# Patient Record
Sex: Female | Born: 1955 | Race: White | Hispanic: No | Smoking: Current some day smoker
Health system: Southern US, Community
[De-identification: ages and names within clinical notes are randomized; demographics above are authoritative.]

## PROBLEM LIST (undated history)

## (undated) DIAGNOSIS — Z8249 Family history of ischemic heart disease and other diseases of the circulatory system: Secondary | ICD-10-CM

## (undated) DIAGNOSIS — F17200 Nicotine dependence, unspecified, uncomplicated: Secondary | ICD-10-CM

## (undated) DIAGNOSIS — Z973 Presence of spectacles and contact lenses: Secondary | ICD-10-CM

## (undated) DIAGNOSIS — E0500 Thyrotoxicosis with diffuse goiter without thyrotoxic crisis or storm: Secondary | ICD-10-CM

## (undated) DIAGNOSIS — Z87442 Personal history of urinary calculi: Secondary | ICD-10-CM

## (undated) DIAGNOSIS — M8580 Other specified disorders of bone density and structure, unspecified site: Secondary | ICD-10-CM

## (undated) DIAGNOSIS — Z86010 Personal history of colonic polyps: Secondary | ICD-10-CM

## (undated) DIAGNOSIS — Z8541 Personal history of malignant neoplasm of cervix uteri: Secondary | ICD-10-CM

## (undated) DIAGNOSIS — T7840XA Allergy, unspecified, initial encounter: Secondary | ICD-10-CM

## (undated) HISTORY — DX: Other specified disorders of bone density and structure, unspecified site: M85.80

## (undated) HISTORY — PX: ABDOMINAL HYSTERECTOMY: SHX81

## (undated) HISTORY — DX: Personal history of urinary calculi: Z87.442

## (undated) HISTORY — DX: Nicotine dependence, unspecified, uncomplicated: F17.200

## (undated) HISTORY — DX: Personal history of malignant neoplasm of cervix uteri: Z85.41

## (undated) HISTORY — DX: Thyrotoxicosis with diffuse goiter without thyrotoxic crisis or storm: E05.00

## (undated) HISTORY — DX: Family history of ischemic heart disease and other diseases of the circulatory system: Z82.49

## (undated) HISTORY — DX: Allergy, unspecified, initial encounter: T78.40XA

## (undated) HISTORY — PX: COLONOSCOPY: SHX174

## (undated) HISTORY — DX: Presence of spectacles and contact lenses: Z97.3

## (undated) HISTORY — PX: COLPOSCOPY: SHX161

## (undated) HISTORY — DX: Personal history of colonic polyps: Z86.010

---

## 2005-04-30 DIAGNOSIS — Z860100 Personal history of colon polyps, unspecified: Secondary | ICD-10-CM

## 2005-04-30 HISTORY — DX: Personal history of colonic polyps: Z86.010

## 2005-04-30 HISTORY — DX: Personal history of colon polyps, unspecified: Z86.0100

## 2005-11-13 ENCOUNTER — Ambulatory Visit: Payer: Self-pay | Admitting: Family Medicine

## 2005-11-21 ENCOUNTER — Ambulatory Visit: Payer: Self-pay | Admitting: Family Medicine

## 2005-11-21 ENCOUNTER — Other Ambulatory Visit: Admission: RE | Admit: 2005-11-21 | Discharge: 2005-11-21 | Payer: Self-pay | Admitting: Family Medicine

## 2005-11-21 LAB — HM PAP SMEAR: HM Pap smear: NEGATIVE

## 2005-11-23 ENCOUNTER — Ambulatory Visit: Payer: Self-pay | Admitting: Gastroenterology

## 2005-12-12 ENCOUNTER — Encounter (INDEPENDENT_AMBULATORY_CARE_PROVIDER_SITE_OTHER): Payer: Self-pay | Admitting: Specialist

## 2005-12-12 ENCOUNTER — Ambulatory Visit: Payer: Self-pay | Admitting: Gastroenterology

## 2005-12-12 LAB — HM COLONOSCOPY

## 2006-01-22 ENCOUNTER — Ambulatory Visit: Payer: Self-pay | Admitting: Family Medicine

## 2007-01-20 ENCOUNTER — Ambulatory Visit: Payer: Self-pay | Admitting: Family Medicine

## 2007-02-27 ENCOUNTER — Ambulatory Visit: Payer: Self-pay | Admitting: Family Medicine

## 2008-03-01 ENCOUNTER — Ambulatory Visit: Payer: Self-pay | Admitting: Family Medicine

## 2008-03-29 ENCOUNTER — Ambulatory Visit: Payer: Self-pay | Admitting: Family Medicine

## 2009-01-18 ENCOUNTER — Ambulatory Visit: Payer: Self-pay | Admitting: Family Medicine

## 2009-02-03 LAB — HM DEXA SCAN

## 2009-02-09 ENCOUNTER — Ambulatory Visit: Payer: Self-pay | Admitting: Family Medicine

## 2009-03-28 ENCOUNTER — Ambulatory Visit: Payer: Self-pay | Admitting: Family Medicine

## 2009-10-26 ENCOUNTER — Ambulatory Visit: Payer: Self-pay | Admitting: Family Medicine

## 2009-11-25 ENCOUNTER — Ambulatory Visit: Payer: Self-pay | Admitting: Family Medicine

## 2009-12-28 ENCOUNTER — Ambulatory Visit: Payer: Self-pay | Admitting: Vascular Surgery

## 2009-12-28 ENCOUNTER — Ambulatory Visit: Payer: Self-pay | Admitting: Family Medicine

## 2010-03-28 ENCOUNTER — Ambulatory Visit: Payer: Self-pay | Admitting: Vascular Surgery

## 2010-04-30 HISTORY — PX: VEIN SURGERY: SHX48

## 2010-05-17 ENCOUNTER — Ambulatory Visit
Admission: RE | Admit: 2010-05-17 | Discharge: 2010-05-17 | Payer: Self-pay | Source: Home / Self Care | Attending: Vascular Surgery | Admitting: Vascular Surgery

## 2010-05-18 NOTE — Assessment & Plan Note (Signed)
OFFICE VISIT  Jodi Flynn, Jodi Flynn DOB:  1956-02-23                                       05/17/2010 NGEXB#:[id number]  This patient underwent laser ablation of her left great saphenous vein from just below her knee to just below the saphenofemoral junction and multiple stab phlebectomies greater than 10 over her medial thigh, medial calf and lateral calf.  This was done under local anesthesia in our office.  She had no immediate complications and was discharged home. She will be seen again in 1 week with follow-up duplex.    Larina Earthly, M.D. Electronically Signed  TFE/MEDQ  D:  05/17/2010  T:  05/18/2010  Job:  [id number]  cc:   Sharlot Gowda, M.D.

## 2010-05-24 ENCOUNTER — Ambulatory Visit: Admit: 2010-05-24 | Payer: Self-pay | Admitting: Vascular Surgery

## 2010-05-24 ENCOUNTER — Ambulatory Visit
Admission: RE | Admit: 2010-05-24 | Discharge: 2010-05-24 | Payer: Self-pay | Source: Home / Self Care | Attending: Vascular Surgery | Admitting: Vascular Surgery

## 2010-05-25 NOTE — Assessment & Plan Note (Signed)
OFFICE VISIT  Jodi Flynn, Jodi Flynn DOB:  Feb 28, 1956                                       05/24/2010 GNFAO#:[id number]  This patient presents today in a 1-week follow-up after laser ablation of her left great saphenous vein, stab phlebectomy of multiple varicosities.  She had the  typical amount of discomfort over her ablation site..  She has some mild bruising as well.  She reports good pain relief with the ibuprofen.  She has been very compliant with her compression garment.  She underwent noninvasive vascular  follow-up with [place] today and this does show complete closure of her left great saphenous vein from the ablation site in her mid calf up to the saphenofemoral junction.  She does not have any evidence of DVT.  I am quite pleased with her initial follow-up and result as is this patient.  I plan to see her again on a p.Flynn.n. basis.    Larina Earthly, M.D. Electronically Signed  TFE/MEDQ  D:  05/24/2010  T:  05/25/2010  Job:  [id number]  cc:   Sharlot Gowda, M.D.

## 2010-06-02 NOTE — Procedures (Unsigned)
DUPLEX DEEP VENOUS EXAM - LOWER EXTREMITY  INDICATION:  Status post left greater saphenous vein ablation.  HISTORY:  Edema:  No. Trauma/Surgery:  Left greater saphenous vein ablation, 05/17/2010. Pain:  No. PE:  No. Previous DVT:  No. Anticoagulants:  No. Other:  DUPLEX EXAM:               CFV   SFV   PopV  PTV    GSV               R  L  R  L  R  L  R   L  R  L Thrombosis    o  o     o     o      o     + Spontaneous   +  +     +     +      +     o Phasic        +  +     +     +      +     o Augmentation  +  +     +     +      +     o Compressible  +  +     +     +      +     o Competent     +  +     +     +      +     +  Legend:  + - yes  o - no  p - partial  D - decreased  IMPRESSION:  No evidence of acute deep venous thrombosis within the left lower extremity.  Successful left greater saphenous vein ablation from the distal insertion site to saphenofemoral junction.   _____________________________ Larina Earthly, M.D.  OD/MEDQ  D:  05/24/2010  T:  05/24/2010  Job:  [id number]

## 2010-09-12 NOTE — Consult Note (Signed)
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  She is a very active healthy 55-  year-old white female who has had progressive pain in her right calf for  the past several years.  She reports this has been present for some time  but is having increased severe difficulty.  She reports pain  specifically over her varicosities and also an aching sensation at the  end of the day.  This requires her to stop walking and sit and raise her  leg for relief of this.  She does not have any similar symptoms in her  right leg.  She does not have any history of DVT or bleeding from her  veins.   Her past medical history is completely unremarkable.  She does have  history of hysterectomy in 1988.   SOCIAL HISTORY:  She is widowed with two children.  She quit smoking 2  months ago and does not drink alcohol.   Family history is positive for varicosities in her mother and father.   REVIEW OF SYSTEMS:  Is otherwise completely normal with no other medical  issues.   PHYSICAL EXAMINATION:  General:  A well-developed, well-nourished white  female appearing stated age, in no acute distress.  Vital signs:  Blood  pressure is 131/71, pulse 60, respirations 18, temperature is 97.4.  HEENT:  Normal.  Chest:  Nonlabored breathing, clear bilaterally.  Cardiovascular:  She has palpable radial and dorsalis pedis pulses  bilaterally.  Musculoskeletal:  She has no major deformities or  cyanosis.  Neurological:  No focal weakness or paresthesias.  Skin:  Without ulcers or rashes.  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  She has no  varicosities on the right leg.  She has no skin changes of chronic  venous hypertension.   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  I had a long discussion  with the patient explaining the significance of this.  I explained that  this is not dangerous and did not put her at risk for more serious  complications.  I did explain because of her pain as related to the  venous hypertension we have recommended continued elevation when  possible for symptom relief.  She will use Advil as well and we have  fitted her today with graduated compression garments 20-30 mmHg thigh-  high and instructed her on the daily use of these.  We will see her  again in 3 months to determine if she is having successful treatment  with these conservative therapies.  I did explain that she would be a  candidate for laser ablation of her saphenous vein and stab phlebectomy  of tributary varicosities should she fail conservative treatment.  We  will see her again in 3 months for continued followup.     Larina Earthly, M.D.  Electronically Signed   TFE/MEDQ  D:  12/28/2009  T:  12/29/2009  Job:  [id number]   cc:  Sharlot Gowda, M.D.

## 2010-09-12 NOTE — Procedures (Signed)
LOWER EXTREMITY VENOUS REFLUX EXAM   INDICATION:  Painful left leg.   EXAM:  Using color-flow imaging and pulse Doppler spectral analysis, the  left common femoral, superficial femoral, popliteal, posterior tibial,  greater and lesser saphenous veins are evaluated.  There is evidence  suggesting deep venous insufficiency in the left lower extremity.   The left saphenofemoral junction is not competent with Reflux of  >571milliseconds. The left GSV is not competent with Reflux of  >571milliseconds with the caliber as described below.   The left proximal short saphenous vein demonstrates competency.   GSV Diameter (used if found to be incompetent only)                                            Right    Left  Proximal Greater Saphenous Vein           cm       0.62 cm  Proximal-to-mid-thigh                     cm       cm  Mid thigh                                 cm       0.42 cm  Mid-distal thigh                          cm       cm  Distal thigh                              cm       0.39 cm  Knee                                      cm       0.25 cm   IMPRESSION:  1. Left greater saphenous vein is not competent with Reflux      >556milliseconds.  2. The left greater saphenous vein is not tortuous.  3. The deep venous system is not competent with Reflux of      >521milliseconds.  4. The left lesser saphenous vein is competent.   ___________________________________________  Larina Earthly, M.D.   CB/MEDQ  D:  12/29/2009  T:  12/29/2009  Job:  [id number]

## 2010-09-12 NOTE — Assessment & Plan Note (Signed)
OFFICE VISIT   [place], Reatha R  DOB:  04-03-1956                                       03/28/2010  JWJXB#:[id number]   The patient is here today for continued discussion regarding her left  leg discomfort.  She has worn compression garments for 3 months and  reports this has given her no improvement in her symptoms.  She reports  she has pain in her calf and over the varicosities in her medial  thigh  and calf with prolonged standing.  This occasionally can extend down  towards her groin.  She reports standing is extremely difficult due to  leg pain, shopping, cooking and cleaning is difficult.  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   I feel that she has clearly failed conservative therapy and have  recommended laser ablation of her left great saphenous vein and stab  phlebectomy of tributary varicosities.  I again imaged her vein with  SonoSite and these varicosities do extend off of the saphenous vein.  I  explained the treatment as outpatient under local anesthesia and she  wishes to proceed.  She does have a 2+ left posterior tibial pulse and a  2+ right dorsalis pedis pulse.  We will schedule this at her earliest  convenience.     Larina Earthly, M.D.  Electronically Signed   TFE/MEDQ  D:  03/28/2010  T:  03/29/2010  Job:  [id number]   cc:   Sharlot Gowda, M.D.

## 2011-01-26 ENCOUNTER — Encounter: Payer: Self-pay | Admitting: Gastroenterology

## 2011-02-13 ENCOUNTER — Telehealth: Payer: Self-pay | Admitting: Medical

## 2011-02-14 ENCOUNTER — Other Ambulatory Visit: Payer: Self-pay | Admitting: Medical

## 2011-02-14 NOTE — Telephone Encounter (Signed)
Patient wants a refill on her fosamax and her synthroid. She was last seen on 12/28/09. She said she mad an appointment for with for a CPE. What would you like for me to do? CLS

## 2011-02-14 NOTE — Telephone Encounter (Signed)
pls refill both meds x 31mo.  I don't have chart and meds not in computer.  So pull chart and do the refills.

## 2011-02-16 ENCOUNTER — Encounter: Payer: Self-pay | Admitting: Medical

## 2011-02-16 ENCOUNTER — Other Ambulatory Visit: Payer: Self-pay | Admitting: Medical

## 2011-02-16 ENCOUNTER — Other Ambulatory Visit (HOSPITAL_COMMUNITY)
Admission: RE | Admit: 2011-02-16 | Discharge: 2011-02-16 | Disposition: A | Discharge status: Home / Self Care | Payer: PRIVATE HEALTH INSURANCE | Source: Ambulatory Visit | Attending: Medical | Admitting: Medical

## 2011-02-16 ENCOUNTER — Ambulatory Visit (INDEPENDENT_AMBULATORY_CARE_PROVIDER_SITE_OTHER): Payer: PRIVATE HEALTH INSURANCE | Admitting: Medical

## 2011-02-16 DIAGNOSIS — F17200 Nicotine dependence, unspecified, uncomplicated: Secondary | ICD-10-CM

## 2011-02-16 DIAGNOSIS — M810 Age-related osteoporosis without current pathological fracture: Secondary | ICD-10-CM

## 2011-02-16 DIAGNOSIS — Z1231 Encounter for screening mammogram for malignant neoplasm of breast: Secondary | ICD-10-CM

## 2011-02-16 DIAGNOSIS — L209 Atopic dermatitis, unspecified: Secondary | ICD-10-CM

## 2011-02-16 DIAGNOSIS — E0500 Thyrotoxicosis with diffuse goiter without thyrotoxic crisis or storm: Secondary | ICD-10-CM

## 2011-02-16 DIAGNOSIS — Z01419 Encounter for gynecological examination (general) (routine) without abnormal findings: Secondary | ICD-10-CM | POA: Insufficient documentation

## 2011-02-16 DIAGNOSIS — Z23 Encounter for immunization: Secondary | ICD-10-CM

## 2011-02-16 DIAGNOSIS — L2089 Other atopic dermatitis: Secondary | ICD-10-CM

## 2011-02-16 DIAGNOSIS — Z0000 Encounter for general adult medical examination without abnormal findings: Secondary | ICD-10-CM

## 2011-02-16 LAB — COMPREHENSIVE METABOLIC PANEL
ALT: 12 U/L (ref 0–35)
AST: 16 U/L (ref 0–37)
Albumin: 4.6 g/dL (ref 3.5–5.2)
Alkaline Phosphatase: 44 U/L (ref 39–117)
BUN: 13 mg/dL (ref 6–23)
Creat: 0.87 mg/dL (ref 0.50–1.10)
Potassium: 4.4 mEq/L (ref 3.5–5.3)

## 2011-02-16 LAB — CBC WITH DIFFERENTIAL/PLATELET
Basophils Relative: 1 % (ref 0–1)
Eosinophils Absolute: 0.1 10*3/uL (ref 0.0–0.7)
HCT: 43.6 % (ref 36.0–46.0)
Hemoglobin: 14.6 g/dL (ref 12.0–15.0)
MCH: 32.9 pg (ref 26.0–34.0)
MCHC: 33.5 g/dL (ref 30.0–36.0)
Monocytes Absolute: 0.5 10*3/uL (ref 0.1–1.0)
Monocytes Relative: 7 % (ref 3–12)
RDW: 13.3 % (ref 11.5–15.5)

## 2011-02-16 LAB — POCT URINALYSIS DIPSTICK
Bilirubin, UA: NEGATIVE
Glucose, UA: NEGATIVE
Leukocytes, UA: NEGATIVE
Nitrite, UA: NEGATIVE
Urobilinogen, UA: NEGATIVE

## 2011-02-16 LAB — T4, FREE: Free T4: 1.47 ng/dL (ref 0.80–1.80)

## 2011-02-16 LAB — LIPID PANEL
HDL: 87 mg/dL (ref >39)
Triglycerides: 73 mg/dL (ref <150)

## 2011-02-16 MED ORDER — ALENDRONATE SODIUM 70 MG PO TABS: 70.0000 mg | ORAL_TABLET | ORAL | Status: DC

## 2011-02-16 MED ORDER — LEVOTHYROXINE SODIUM 150 MCG PO TABS: 150.0000 ug | ORAL_TABLET | Freq: Every day | ORAL | Status: DC

## 2011-02-16 MED ORDER — HYDROCORTISONE 2.5 % EX CREA: TOPICAL_CREAM | Freq: Two times a day (BID) | CUTANEOUS | Status: AC

## 2011-02-16 NOTE — Patient Instructions (Signed)
X Preventative Care for Adults - Female      MAINTAIN REGULAR HEALTH EXAMS:  A routine yearly physical is a good way to check in with your primary care provider about your health and preventive screening. It is also an opportunity to share updates about your health and any concerns you have, and receive a thorough all-over exam.   Most health insurance companies pay for at least some preventative services.  Check with your health plan for specific coverages.  WHAT PREVENTATIVE SERVICES DO WOMEN NEED?  Adult women should have their weight and blood pressure checked regularly.   Women age 45 and older should have their cholesterol levels checked regularly.  Women should be screened for cervical cancer with a Pap smear and pelvic exam beginning at either age 56, or 3 years after they become sexually activity.    Breast cancer screening generally begins at age 65 with a mammogram and breast exam by your primary care provider.    Beginning at age 65 and continuing to age 28, women should be screened for colorectal cancer.  Certain people may need continued testing until age 103.  Updating vaccinations is part of preventative care.  Vaccinations help protect against diseases such as the flu.  Osteoporosis is a disease in which the bones lose minerals and strength as we age. Women ages 33 and over should discuss this with their caregivers, as should women after menopause who have other risk factors.  Lab tests are generally done as part of preventative care to screen for anemia and blood disorders, to screen for problems with the kidneys and liver, to screen for bladder problems, to check blood sugar, and to check your cholesterol level.  Preventative services generally include counseling about diet, exercise, avoiding tobacco, drugs, excessive alcohol consumption, and sexually transmitted infections.    GENERAL RECOMMENDATIONS FOR GOOD HEALTH:  Healthy diet:  Eat a variety of foods,  including fruit, vegetables, animal or vegetable protein, such as meat, fish, chicken, and eggs, or beans, lentils, tofu, and grains, such as rice.  Drink plenty of water daily.  Decrease saturated fat in the diet, avoid lots of red meat, processed foods, sweets, fast foods, and fried foods.  Exercise:  Aerobic exercise helps maintain good heart health. At least 30-40 minutes of moderate-intensity exercise is recommended. For example, a brisk walk that increases your heart rate and breathing. This should be done on most days of the week.   Find a type of exercise or a variety of exercises that you enjoy so that it becomes a part of your daily life.  Examples are running, walking, swimming, water aerobics, and biking.  For motivation and support, explore group exercise such as aerobic class, spin class, Zumba, Yoga,or  martial arts, etc.    Set exercise goals for yourself, such as a certain weight goal, walk or run in a race such as a 5k walk/run.  Speak to your primary care provider about exercise goals.  Disease prevention:  If you smoke or chew tobacco, find out from your caregiver how to quit. It can literally save your life, no matter how long you have been a tobacco user. If you do not use tobacco, never begin.   Maintain a healthy diet and normal weight. Increased weight leads to problems with blood pressure and diabetes.   The Body Mass Index or BMI is a way of measuring how much of your body is fat. Having a BMI above 27 increases the risk of heart  disease, diabetes, hypertension, stroke and other problems related to obesity. Your caregiver can help determine your BMI and based on it develop an exercise and dietary program to help you achieve or maintain this important measurement at a healthful level.  High blood pressure causes heart and blood vessel problems.  Persistent high blood pressure should be treated with medicine if weight loss and exercise do not work.   Fat and  cholesterol leaves deposits in your arteries that can block them. This causes heart disease and vessel disease elsewhere in your body.  If your cholesterol is found to be high, or if you have heart disease or certain other medical conditions, then you may need to have your cholesterol monitored frequently and be treated with medication.   Ask if you should have a cardiac stress test if your history suggests this. A stress test is a test done on a treadmill that looks for heart disease. This test can find disease prior to there being a problem.  Menopause can be associated with physical symptoms and risks. Hormone replacement therapy is available to decrease these. You should talk to your caregiver about whether starting or continuing to take hormones is right for you.   Osteoporosis is a disease in which the bones lose minerals and strength as we age. This can result in serious bone fractures. Risk of osteoporosis can be identified using a bone density scan. Women ages 43 and over should discuss this with their caregivers, as should women after menopause who have other risk factors. Ask your caregiver whether you should be taking a calcium supplement and Vitamin D, to reduce the rate of osteoporosis.   Avoid drinking alcohol in excess (more than two drinks per day).  Avoid use of street drugs. Do not share needles with anyone. Ask for professional help if you need assistance or instructions on stopping the use of alcohol, cigarettes, and/or drugs.  Brush your teeth twice a day with fluoride toothpaste, and floss once a day. Good oral hygiene prevents tooth decay and gum disease. The problems can be painful, unattractive, and can cause other health problems. Visit your dentist for a routine oral and dental check up and preventive care every 6-12 months.   Look at your skin regularly.  Use a mirror to look at your back. Notify your caregivers of changes in moles, especially if there are changes in shapes,  colors, a size larger than a pencil eraser, an irregular border, or development of new moles.  Safety:  Use seatbelts 100% of the time, whether driving or as a passenger.  Use safety devices such as hearing protection if you work in environments with loud noise or significant background noise.  Use safety glasses when doing any work that could send debris in to the eyes.  Use a helmet if you ride a bike or motorcycle.  Use appropriate safety gear for contact sports.  Talk to your caregiver about gun safety.  Use sunscreen with a SPF (or skin protection factor) of 15 or greater.  Lighter skinned people are at a greater risk of skin cancer. Don't forget to also wear sunglasses in order to protect your eyes from too much damaging sunlight. Damaging sunlight can accelerate cataract formation.   Practice safe sex. Use condoms. Condoms are used for birth control and to help reduce the spread of sexually transmitted infections (or STIs).  Some of the STIs are gonorrhea (the clap), chlamydia, syphilis, trichomonas, herpes, HPV (human papilloma virus) and HIV (human immunodeficiency  virus) which causes AIDS. The herpes, HIV and HPV are viral illnesses that have no cure. These can result in disability, cancer and death.   Keep carbon monoxide and smoke detectors in your home functioning at all times. Change the batteries every 6 months or use a model that plugs into the wall.   Vaccinations:  Stay up to date with your tetanus shots and other required immunizations. You should have a booster for tetanus every 10 years. Be sure to get your flu shot every year, since 5%-20% of the [place] population comes down with the flu. The flu vaccine changes each year, so being vaccinated once is not enough. Get your shot in the fall, before the flu season peaks.   Other vaccines to consider:  Human Papilloma Virus or HPV causes cancer of the cervix, and other infections that can be transmitted from person to person. There is  a vaccine for HPV, and females should get immunized between the ages of 93 and 88. It requires a series of 3 shots.   Pneumococcal vaccine to protect against certain types of pneumonia.  This is normally recommended for adults age 62 or older.  However, adults younger than 56 years old with certain underlying conditions such as diabetes, heart or lung disease should also receive the vaccine.  Shingles vaccine to protect against Varicella Zoster if you are older than age 74, or younger than 56 years old with certain underlying illness.  Hepatitis A vaccine to protect against a form of infection of the liver by a virus acquired from food.  Hepatitis B vaccine to protect against a form of infection of the liver by a virus acquired from blood or body fluids, particularly if you work in health care.  If you plan to travel internationally, check with your local health department for specific vaccination recommendations.  Cancer Screening:  Breast cancer screening is essential to preventive care for women. All women age 85 and older should perform a breast self-exam every month. At age 8 and older, women should have their caregiver complete a breast exam each year. Women at ages 50 and older should have a mammogram (x-ray film) of the breasts. Your caregiver can discuss how often you need mammograms.    Cervical cancer screening includes taking a Pap smear (sample of cells examined under a microscope) from the cervix (end of the uterus). It also includes testing for HPV (Human Papilloma Virus, which can cause cervical cancer). Screening and a pelvic exam should begin at age 72, or 3 years after a woman becomes sexually active. Screening should occur every year, with a Pap smear but no HPV testing, up to age 40. After age 63, you should have a Pap smear every 3 years with HPV testing, if no HPV was found previously.   Most routine colon cancer screening begins at the age of 39. On a yearly basis, doctors  may provide special easy to use take-home tests to check for hidden blood in the stool. Sigmoidoscopy or colonoscopy can detect the earliest forms of colon cancer and is life saving. These tests use a small camera at the end of a tube to directly examine the colon. Speak to your caregiver about this at age 73, when routine screening begins (and is repeated every 5 years unless early forms of pre-cancerous polyps or small growths are found).

## 2011-02-16 NOTE — Telephone Encounter (Signed)
Refilled the patients 2 medications per [facility]. CLS

## 2011-02-16 NOTE — Progress Notes (Signed)
Subjective:   HPI  Jodi Flynn is a 55 y.o. female who presents for a complete physical.  Needs refills on meds.  She is fasting today.   Only recent problem is rash on hands.  Gets this rash from time to time.  Using nothing for the rash.    She was widowed 2 years ago.  She is in a monogamous relationship currently and doing well.  Is doing some "bucket list" things.  recently started riding a [employer], went scuba diving in the [place] recently.  No other c/o.  Last mammogram 2011, last pap smear 2011?  Declines flu shot.    Reviewed their medical, surgical, family, social, medication, and allergy history and updated chart as appropriate.  Past Medical History  Diagnosis Date  . Hx of colonic polyps 2007  . History of renal stone   . Tobacco use disorder   . Grave's disease   . History of cervical cancer     s/p hysterectomy  . Allergy   . Osteoporosis 01/2009    Bone Density 01/2009  . Wears glasses     Past Surgical History  Procedure Date  . Abdominal hysterectomy     ovaries intact  . Colposcopy     age 25  . Colonoscopy age 80, 12/12/05    Dr. Sheryn Bison  . Vein surgery 1/12    laser ablation left great saphenous vein , stab phlebectomy of multiple varicosities, Dr. Arbie Cookey     Family History  Problem Relation Age of Onset  . COPD Mother     respiratory failure  . Kidney disease Mother   . Hypertension Mother   . Hyperlipidemia Mother   . Heart disease Father     died of MI, late 63s  . Hypertension Father   . Hyperlipidemia Father   . Stroke Father   . Hypertension Brother   . Hyperlipidemia Brother   . Kidney disease Brother     kidney stones  . Diabetes Brother     borderline  . Cancer Neg Hx     History   Social History  . Marital Status: Married    Spouse Name: N/A    Number of Children: N/A  . Years of Education: N/A   Occupational History  . 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 Smoking status:  Current Some Day Smoker -- 0.2 packs/day  . Smokeless tobacco: Never Used  . Alcohol Use: 1.0 oz/week    2 drink(s) per week  . Drug Use: No  . Sexually Active: Yes   Other Topics Concern  . Not on file   Social History Narrative   Widowed in 2010, in monogamous relationship currently, 2 children, walks for exercise, hobbies: puzzles    No current outpatient prescriptions on file prior to visit.    No Known Allergies   Review of Systems Constitutional: denies fever, chills, sweats, unexpected weight change, anorexia, fatigue Allergy: negative; denies recent sneezing, itching, congestion Dermatology: +rash on hands, intermittent; denies changing moles, lumps, new worrisome lesions ENT: no runny nose, ear pain, sore throat, hoarseness, sinus pain, teeth pain, tinnitus, hearing loss, epistaxis Cardiology: denies chest pain, palpitations, edema, orthopnea, paroxysmal nocturnal dyspnea Respiratory: denies cough, shortness of breath, dyspnea on exertion, wheezing, hemoptysis Gastroenterology: denies abdominal pain, nausea, vomiting, diarrhea, constipation, blood in stool, changes in bowel movement, dysphagia Hematology: denies bleeding or bruising problems Musculoskeletal: denies arthralgias, myalgias, joint swelling, back pain, neck pain, cramping,  gait changes Ophthalmology: denies vision changes, eye redness, itching, discharge Urology: denies dysuria, difficulty urinating, hematuria, urinary frequency, urgency, incontinence Neurology: no headache, weakness, tingling, numbness, speech abnormality, memory loss, falls, dizziness Psychology: denies depressed mood, agitation, sleep problems     Objective:   Physical Exam  Filed Vitals:   02/16/11 0855  BP: 118/70  Pulse: 72  Temp: 98 F (36.7 C)  Resp: 18    General appearance: alert, no distress, WD/WN, white female, looks stated age Skin: scattered benign appearing macules and papules, no particular worrisome lesions.   right preauricular area with two papules, both 2-3 mm, flesh colored, unchanged per pt for years, left ear tragus with horn like soft tissue growth, unchanged for years per pt HEENT: normocephalic, conjunctiva/corneas normal, sclerae anicteric, PERRLA, EOMi, nares patent, no discharge or erythema, pharynx normal Oral cavity: MMM, tongue normal, teeth in good repair Neck: supple, no lymphadenopathy, no thyromegaly, no masses, normal ROM, no bruits Chest: non tender, normal shape and expansion Heart: RRR, normal S1, S2, no murmurs Lungs: CTA bilaterally, no wheezes, rhonchi, or rales Abdomen: +bs, soft, non tender, non distended, no masses, no hepatomegaly, no splenomegaly, no bruits Back: non tender, normal ROM, no scoliosis Musculoskeletal: upper extremities non tender, no obvious deformity, normal ROM throughout, lower extremities non tender, no obvious deformity, normal ROM throughout Extremities: no edema, no cyanosis, no clubbing Pulses: 2+ symmetric, upper and lower extremities, normal cap refill Neurological: alert, oriented x 3, CN2-12 intact, strength normal upper extremities and lower extremities, sensation normal throughout, DTRs 2+ throughout, no cerebellar signs, gait normal Psychiatric: normal affect, behavior normal, pleasant  Breasts: nontender, no mass, no axillary lymphadenopathy Gyn: normal external genitalia, no discharge, no lesions, no adnexal mass or tenderness, swabs taken, s/p hysterectomy Anus - normal appearing, no mass, occult negative stool   Assessment :      Encounter Diagnoses  Name Primary?  . General medical examination Yes  . Grave's disease   . Osteoporosis   . Need for prophylactic vaccination and inoculation against influenza   . Visit for screening mammogram   . Osteoporosis, unspecified   . Tobacco use disorder   . Atopic dermatitis      Plan:    Physical exam - discussed healthy lifestyle, diet, exercise, preventative care, vaccinations,  and addressed their concerns.  Labs today.  She is UTD on tetanus vaccine.  She declines STD screening.   Grave's disease - thyroid labs today, med refills today  Osteoporosis - c/t Fosamax.  Repeat bone density exam.  Advised fall precautions.  C/t Ca+Vit D.    Flu vaccine and VIS given.  Tobacco use disorder - advised she completely stop smoking.    Recommended repeat screening colonoscopy.  She is due at this time. She will consider and let me know .  Will set up for screening mammogram.  Pap smear sent.    Atopic dermatitis - sent script for Hydrocortisone cream.

## 2011-02-19 ENCOUNTER — Telehealth: Payer: Self-pay | Admitting: Family Medicine

## 2011-02-19 LAB — VITAMIN D 25 HYDROXY (VIT D DEFICIENCY, FRACTURES): Vit D, 25-Hydroxy: 64 ng/mL (ref 30–89)

## 2011-02-19 NOTE — Telephone Encounter (Signed)
The lab tech. Added the vitamin D to the patients lab work. CLS

## 2011-02-19 NOTE — Telephone Encounter (Signed)
Message copied by Janeice Robinson on Mon Feb 19, 2011  4:32 PM ------      Message from: Robinson, [place]      Created: Fri Feb 16, 2011  6:06 PM       See if we can add Vitamin D level at lab.  I sent hydrocortisone cream for her hand rash.

## 2011-02-20 ENCOUNTER — Telehealth: Payer: Self-pay | Admitting: Medical

## 2011-02-20 NOTE — Telephone Encounter (Signed)
PATIENT WAS NOTIFIED. CLS 

## 2011-02-20 NOTE — Progress Notes (Signed)
LM for pt. To callback. CLS

## 2011-02-27 ENCOUNTER — Ambulatory Visit (INDEPENDENT_AMBULATORY_CARE_PROVIDER_SITE_OTHER): Payer: PRIVATE HEALTH INSURANCE | Admitting: Medical

## 2011-02-27 ENCOUNTER — Encounter: Payer: Self-pay | Admitting: Medical

## 2011-02-27 ENCOUNTER — Encounter: Payer: Self-pay | Admitting: Family Medicine

## 2011-02-27 DIAGNOSIS — R319 Hematuria, unspecified: Secondary | ICD-10-CM | POA: Insufficient documentation

## 2011-02-27 LAB — POCT URINALYSIS DIPSTICK
Nitrite, UA: NEGATIVE
Spec Grav, UA: 1.005
Urobilinogen, UA: NEGATIVE

## 2011-02-27 LAB — POC HEMOCCULT BLD/STL (OFFICE/1-CARD/DIAGNOSTIC): Fecal Occult Blood, POC: NEGATIVE

## 2011-02-27 MED ORDER — NITROFURANTOIN MONOHYD MACRO 100 MG PO CAPS: 100.0000 mg | ORAL_CAPSULE | Freq: Two times a day (BID) | ORAL | Status: AC

## 2011-02-27 NOTE — Patient Instructions (Signed)

## 2011-02-27 NOTE — Progress Notes (Signed)
Subjective:   HPI  Jodi Flynn is a 55 y.o. female who presents for 1 day hx/o hematuria.  She has been feeling fine, but this morning saw some blood initial with urination and on toilet paper, then while in the shower, saw some clumps/clots of blood.  Throughout the morning continued to get some pink urine.  She notes some urinary urgency and frequency.  No pain or burning.  Last UTI was age 41.  No hx/o hematuria other than with kidney stones in remote past.   Otherwise feels fine.  Of note I recently saw her for physical and she has been referred to gynecology for abnormal pap smear.  No other aggravating or relieving factors.    No other c/o.  The following portions of the patient's history were reviewed and updated as appropriate: allergies, current medications, past family history, past medical history, past social history, past surgical history and problem list.  Past Medical History  Diagnosis Date  . Hx of colonic polyps 2007  . History of renal stone   . Tobacco use disorder   . Grave's disease   . History of cervical cancer     s/p hysterectomy  . Allergy   . Osteoporosis 01/2009    Bone Density 01/2009  . Wears glasses   . Cancer     CERVICAL   Review of Systems Constitutional: -fever, -chills, -sweats, -unexpected -weight change,-fatigue ENT: -runny nose, -ear pain, -sore throat Cardiology:  -chest pain, -palpitations, -edema Respiratory: -cough, -shortness of breath, -wheezing Gastroenterology: -abdominal pain, -nausea, -vomiting, -diarrhea, -constipation Hematology: -bleeding or bruising problems Musculoskeletal: -arthralgias, -myalgias, -joint swelling, -back pain Ophthalmology: -vision changes Urology: -dysuria, -difficulty urinating, +hematuria, +urinary frequency, -urgency Neurology: -headache, -weakness, -tingling, -numbness    Objective:   Physical Exam  Filed Vitals:   02/27/11 0845  BP: 100/70  Pulse: 72  Temp: 97.7 F (36.5 C)  Resp: 16     General appearance: alert, no distress, WD/WN Abdomen: +bs, soft, non tender, non distended, no masses, no hepatomegaly, no splenomegaly Back: nontender, no CVA tenderness Pulses: 2+ symmetric, upper and lower extremities, normal cap refill   Assessment and Plan :    Encounter Diagnosis  Name Primary?  . Hematuria Yes     Urine microscopic today with several RBCs, some crenated RBCs, but not casts or other abnormalities.  Discussed possible etiologies with patient.  Her risk factors are age and smoker.  Will send for culture.  Will cover empirically with Macrobid.  If symptoms worsen or change in the meantime she will let me know.   Advised that regardless of culture, she will need to recheck to f/u on gross hematuria again in 2wk, sooner if not improving.

## 2011-03-01 NOTE — Progress Notes (Signed)
Lmom for the patient to callback. CLS 

## 2011-03-08 ENCOUNTER — Telehealth: Payer: Self-pay | Admitting: Family Medicine

## 2011-03-08 ENCOUNTER — Encounter: Payer: Self-pay | Admitting: Family Medicine

## 2011-03-08 NOTE — Telephone Encounter (Signed)
Message copied by Janeice Robinson on Thu Mar 08, 2011  9:28 AM ------      Message from: Medford, DAVID S      Created: Thu Mar 08, 2011  7:59 AM       Good news: her bone density scan on 02/22/11 shows that there has been a significant increase in her bone density from previous scans.  Thus, the medication is working.  C/t Fosamax as usual, get weight bearing exercise such as circuit training or lifting some weight at home or the gym at least once weekly.  C/t Ca 1500mg  + Vit D 1000 IU OTC daily.

## 2011-04-30 ENCOUNTER — Other Ambulatory Visit: Payer: Self-pay | Admitting: Medical

## 2011-04-30 ENCOUNTER — Telehealth: Payer: Self-pay | Admitting: Internal Medicine

## 2011-04-30 MED ORDER — NITROFURANTOIN MONOHYD MACRO 100 MG PO CAPS: 100.0000 mg | ORAL_CAPSULE | Freq: Two times a day (BID) | ORAL | Status: AC

## 2011-04-30 NOTE — Telephone Encounter (Signed)
Spoke with patient and let her know that rx for Macrobid was called to pharmacy and to return if not improving for OV. Pt verbalized understanding.

## 2011-04-30 NOTE — Telephone Encounter (Signed)
macrobid sent, but if not improving, then return.

## 2011-10-03 ENCOUNTER — Ambulatory Visit (INDEPENDENT_AMBULATORY_CARE_PROVIDER_SITE_OTHER): Payer: Commercial Managed Care - PPO | Admitting: Medical

## 2011-10-03 ENCOUNTER — Encounter: Payer: Self-pay | Admitting: Medical

## 2011-10-03 VITALS — BP 100/70 | HR 68 | Temp 97.9°F | Resp 16 | Wt 132.0 lb

## 2011-10-03 DIAGNOSIS — L255 Unspecified contact dermatitis due to plants, except food: Secondary | ICD-10-CM

## 2011-10-03 DIAGNOSIS — L237 Allergic contact dermatitis due to plants, except food: Secondary | ICD-10-CM

## 2011-10-03 MED ORDER — METHYLPREDNISOLONE SODIUM SUCC 125 MG IJ SOLR
125.0000 mg | Freq: Once | INTRAMUSCULAR | Status: AC
  Administered 2011-10-03: 125 mg via INTRAMUSCULAR

## 2011-10-03 NOTE — Patient Instructions (Signed)
Poison Ivy Poison ivy is a inflammation of the skin (contact dermatitis) caused by touching the allergens on the leaves of the ivy plant following previous exposure to the plant. The rash usually appears 48 hours after exposure. The rash is usually bumps (papules) or blisters (vesicles) in a linear pattern. Depending on your own sensitivity, the rash may simply cause redness and itching, or it may also progress to blisters which may break open. These must be well cared for to prevent secondary bacterial (germ) infection, followed by scarring. Keep any open areas dry, clean, dressed, and covered with an antibacterial ointment if needed. The eyes may also get puffy. The puffiness is worst in the morning and gets better as the day progresses. This dermatitis usually heals without scarring, within 2 to 3 weeks without treatment. HOME CARE INSTRUCTIONS  Thoroughly wash with soap and water as soon as you have been exposed to poison ivy. You have about one half hour to remove the plant resin before it will cause the rash. This washing will destroy the oil or antigen on the skin that is causing, or will cause, the rash. Be sure to wash under your fingernails as any plant resin there will continue to spread the rash. Do not rub skin vigorously when washing affected area. Poison ivy cannot spread if no oil from the plant remains on your body. A rash that has progressed to weeping sores will not spread the rash unless you have not washed thoroughly. It is also important to wash any clothes you have been wearing as these may carry active allergens. The rash will return if you wear the unwashed clothing, even several days later. Avoidance of the plant in the future is the best measure. Poison ivy plant can be recognized by the number of leaves. Generally, poison ivy has three leaves with flowering branches on a single stem. Diphenhydramine may be purchased over the counter and used as needed for itching. Do not drive with  this medication if it makes you drowsy.Ask your caregiver about medication for children. SEEK MEDICAL CARE IF:  Open sores develop.   Redness spreads beyond area of rash.   You notice purulent (pus-like) discharge.   You have increased pain.   Other signs of infection develop (such as fever).  Document Released: 04/13/2000 Document Revised: 04/05/2011 Document Reviewed: 03/02/2009 [employer] Patient Information 2012 [employer]. 

## 2011-10-03 NOTE — Progress Notes (Signed)
Addended by: Janeice Robinson on: 10/03/2011 10:38 AM   Modules accepted: Orders

## 2011-10-03 NOTE — Progress Notes (Signed)
  Subjective:   HPI  Jodi Flynn is a 56 y.o. female who presents for possible poison ivy dermatitis.   She does note remote hx/o rash with exposure as a child.  She was out in the "natural area" in her yard over the weekend cleaning out weeds and pulling weeds off of trees.  Was wearing tank top and shorts.  She has since gotten rash all over - arms, legs, torso, itchy. Using OTC Ivarest and Ivywash.  No other aggravating or relieving factors.    No other c/o.  The following portions of the patient's history were reviewed and updated as appropriate: allergies, current medications, past family history, past medical history, past social history, past surgical history and problem list.  Past Medical History  Diagnosis Date  . Hx of colonic polyps 2007  . History of renal stone   . Tobacco use disorder   . Grave's disease   . History of cervical cancer     s/p hysterectomy  . Allergy   . Osteoporosis 01/2009    Bone Density 01/2009  . Wears glasses   . Cancer     CERVICAL    No Known Allergies   Review of Systems ROS reviewed and was negative other than noted in HPI or above.    Objective:   Physical Exam  General appearance: alert, no distress, WD/WN Skin: scattered erythema, 2-3 mm papules and some serous drainage on upper and lower arms, similarly on upper and lower legs, similarly on left chest, back all consistent with plant dermatitis.    Assessment and Plan :     Encounter Diagnosis  Name Primary?  . Poison ivy dermatitis Yes   IM Solumedrol 125 mg today.  Advised OTC Benadryl BID, and she will use the Hydrocortisone 2.5% cream topically she has at home from previous visit.  Avoid reexposure.   Call/return if not improving.

## 2012-02-11 ENCOUNTER — Encounter: Payer: Self-pay | Admitting: Internal Medicine

## 2012-02-15 ENCOUNTER — Encounter: Payer: Self-pay | Admitting: Medical

## 2012-02-15 ENCOUNTER — Ambulatory Visit (INDEPENDENT_AMBULATORY_CARE_PROVIDER_SITE_OTHER): Payer: Commercial Managed Care - PPO | Admitting: Medical

## 2012-02-15 VITALS — BP 102/80 | HR 92 | Temp 98.1°F | Resp 16 | Wt 133.0 lb

## 2012-02-15 DIAGNOSIS — Z23 Encounter for immunization: Secondary | ICD-10-CM

## 2012-02-15 DIAGNOSIS — E0500 Thyrotoxicosis with diffuse goiter without thyrotoxic crisis or storm: Secondary | ICD-10-CM

## 2012-02-15 DIAGNOSIS — E039 Hypothyroidism, unspecified: Secondary | ICD-10-CM

## 2012-02-15 DIAGNOSIS — F17200 Nicotine dependence, unspecified, uncomplicated: Secondary | ICD-10-CM

## 2012-02-15 DIAGNOSIS — M810 Age-related osteoporosis without current pathological fracture: Secondary | ICD-10-CM

## 2012-02-15 NOTE — Addendum Note (Signed)
Addended by: Leretha Dykes L on: 02/15/2012 11:12 AM   Modules accepted: Orders

## 2012-02-15 NOTE — Progress Notes (Signed)
Subjective:   HPI  Jodi Flynn is a 56 y.o. female who presents for recheck on hypothyroidism.   Needs refills.  No other c/o.  Been in usual state of health.  She has cut back on tobacco, but not interested in quitting at this time.  At last physical a year ago, she ended up seeing gynecology at my referral for abnormal pap. She reports colposcopy was normal.   Has yearly f/u planned.  She declines flu shot.  No other c/o.  The following portions of the patient's history were reviewed and updated as appropriate: allergies, current medications, past family history, past medical history, past social history, past surgical history and problem list.  Past Medical History  Diagnosis Date  . Hx of colonic polyps 2007  . History of renal stone   . Tobacco use disorder   . Grave's disease   . History of cervical cancer     s/p hysterectomy  . Allergy   . Osteoporosis 01/2009    Bone Density 01/2009  . Wears glasses   . Cancer     CERVICAL    No Known Allergies   Review of Systems ROS reviewed and was negative other than noted in HPI or above.    Objective:   Physical Exam  General appearance: alert, no distress, WD/WN Neck: supple, no lymphadenopathy, no thyromegaly, no masses, no bruits Heart: RRR, normal S1, S2, no murmurs Lungs: decreased breath sounds, right upper field faint wheeze, no rhonchi or rales Abdomen: +bs, soft, non tender, non distended, no masses, no hepatomegaly, no splenomegaly Pulses: 2+ symmetric, upper and lower extremities, normal cap refill   Assessment and Plan :     Encounter Diagnoses  Name Primary?  . Hypothyroidism Yes  . Graves disease   . Tobacco use disorder   . Need for pneumococcal vaccination   . Osteoporosis    Hypothyroidism, history of graves disease - labs today, samples given, c/t same medication.  Tobacco use - discussed risks.  recommended she consider stopping.  She is not ready to quit at this time.  Pneumococcal vaccine,  VIS and counseling given.   Osteoporosis - last bone density a year ago, improved, c/t Ca, Vit D, fosamax.  She declines flu shot and colonoscopy referral at this time.

## 2012-02-18 ENCOUNTER — Other Ambulatory Visit: Payer: Self-pay | Admitting: Medical

## 2012-02-18 DIAGNOSIS — M810 Age-related osteoporosis without current pathological fracture: Secondary | ICD-10-CM

## 2012-02-18 MED ORDER — LEVOTHYROXINE SODIUM 150 MCG PO TABS: 150.0000 ug | ORAL_TABLET | Freq: Every day | ORAL | Status: DC

## 2012-03-19 ENCOUNTER — Other Ambulatory Visit: Payer: Self-pay | Admitting: Medical

## 2012-06-18 ENCOUNTER — Encounter: Payer: Self-pay | Admitting: Family Medicine

## 2012-06-18 ENCOUNTER — Ambulatory Visit (INDEPENDENT_AMBULATORY_CARE_PROVIDER_SITE_OTHER): Payer: Commercial Managed Care - PPO | Admitting: Family Medicine

## 2012-06-18 VITALS — BP 102/68 | HR 80 | Ht 65.0 in | Wt 133.0 lb

## 2012-06-18 DIAGNOSIS — F4321 Adjustment disorder with depressed mood: Secondary | ICD-10-CM

## 2012-06-18 DIAGNOSIS — G8929 Other chronic pain: Secondary | ICD-10-CM

## 2012-06-18 DIAGNOSIS — M79609 Pain in unspecified limb: Secondary | ICD-10-CM

## 2012-06-18 MED ORDER — ALPRAZOLAM 0.5 MG PO TABS: 0.2500 mg | ORAL_TABLET | Freq: Three times a day (TID) | ORAL | Status: DC | PRN

## 2012-06-18 NOTE — Patient Instructions (Addendum)
Try wearing flats (or heels that are lower).  Wear shoes with a wider toe box (wider in general, but not pointy at toes either).  You can try wearing a pad between the 4th and 5th toes. Use tylenol or ibuprofen as needed for pain If pain persists (despite changing shoewear) consider x-ray and/or podiatry evaluation.   Call the numbers I gave you to set up counseling (grief counseling). Take the alprazolam as needed when you are feeling anxious and overwhelmed.  It may make you sleepy--don't mix with alcohol or drive after taking.
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
## 2012-07-16 ENCOUNTER — Other Ambulatory Visit: Payer: Self-pay | Admitting: Family Medicine

## 2012-07-17 NOTE — Telephone Encounter (Signed)
Is this okay to refill? 

## 2012-07-17 NOTE — Telephone Encounter (Signed)
Ok to refill.  Needs OV for any additional refills

## 2012-07-19 ENCOUNTER — Emergency Department (HOSPITAL_COMMUNITY)
Admission: EM | Admit: 2012-07-19 | Discharge: 2012-07-19 | Disposition: A | Discharge status: Home / Self Care | Payer: Commercial Managed Care - PPO | Attending: Emergency Medicine | Admitting: Emergency Medicine

## 2012-07-19 ENCOUNTER — Encounter (HOSPITAL_COMMUNITY): Payer: Self-pay | Admitting: Nurse Practitioner

## 2012-07-19 DIAGNOSIS — S0100XA Unspecified open wound of scalp, initial encounter: Secondary | ICD-10-CM | POA: Insufficient documentation

## 2012-07-19 DIAGNOSIS — Z79899 Other long term (current) drug therapy: Secondary | ICD-10-CM | POA: Insufficient documentation

## 2012-07-19 DIAGNOSIS — Z860100 Personal history of colon polyps, unspecified: Secondary | ICD-10-CM | POA: Insufficient documentation

## 2012-07-19 DIAGNOSIS — Y9289 Other specified places as the place of occurrence of the external cause: Secondary | ICD-10-CM | POA: Insufficient documentation

## 2012-07-19 DIAGNOSIS — Z23 Encounter for immunization: Secondary | ICD-10-CM | POA: Insufficient documentation

## 2012-07-19 DIAGNOSIS — IMO0002 Reserved for concepts with insufficient information to code with codable children: Secondary | ICD-10-CM | POA: Insufficient documentation

## 2012-07-19 DIAGNOSIS — Z87442 Personal history of urinary calculi: Secondary | ICD-10-CM | POA: Insufficient documentation

## 2012-07-19 DIAGNOSIS — S0101XA Laceration without foreign body of scalp, initial encounter: Secondary | ICD-10-CM

## 2012-07-19 DIAGNOSIS — M810 Age-related osteoporosis without current pathological fracture: Secondary | ICD-10-CM | POA: Insufficient documentation

## 2012-07-19 DIAGNOSIS — Z862 Personal history of diseases of the blood and blood-forming organs and certain disorders involving the immune mechanism: Secondary | ICD-10-CM | POA: Insufficient documentation

## 2012-07-19 DIAGNOSIS — Y9389 Activity, other specified: Secondary | ICD-10-CM | POA: Insufficient documentation

## 2012-07-19 DIAGNOSIS — F17200 Nicotine dependence, unspecified, uncomplicated: Secondary | ICD-10-CM | POA: Insufficient documentation

## 2012-07-19 DIAGNOSIS — Z8639 Personal history of other endocrine, nutritional and metabolic disease: Secondary | ICD-10-CM | POA: Insufficient documentation

## 2012-07-19 DIAGNOSIS — Z8541 Personal history of malignant neoplasm of cervix uteri: Secondary | ICD-10-CM | POA: Insufficient documentation

## 2012-07-19 MED ORDER — TETANUS-DIPHTH-ACELL PERTUSSIS 5-2.5-18.5 LF-MCG/0.5 IM SUSP: 0.5000 mL | Freq: Once | INTRAMUSCULAR | Status: AC

## 2012-07-19 MED ORDER — TETANUS-DIPHTH-ACELL PERTUSSIS 5-2.5-18.5 LF-MCG/0.5 IM SUSP
INTRAMUSCULAR | Status: AC
  Administered 2012-07-19: 0.5 mL via INTRAMUSCULAR
  Filled 2012-07-19: qty 0.5

## 2012-07-19 NOTE — ED Notes (Signed)
Pt with laceration to top of head approx 1.5 inches, hit her head on a dvd player. Denies LOC, blurred vision, nausea. C/o pain at site. Bleeding controlled.

## 2012-07-19 NOTE — ED Notes (Signed)
Approx 15 cm laceration to the top of the head. Bleeding controlled. Hit head on DVD player in daughter's vehicle.

## 2012-07-19 NOTE — ED Provider Notes (Signed)
History  This chart was scribed for non-physician practitioner Arthor Captain, PA-C working with Richardean Canal, MD, by Candelaria Stagers, ED Scribe. This patient was seen in room TR06C/TR06C and the patient's care was started at 4:39 PM   CSN: [id number]  Arrival date & time 07/19/12  1607   First MD Initiated Contact with Patient 07/19/12 1616      Chief Complaint  Patient presents with  . Head Laceration     The history is provided by the patient. No language interpreter was used.   Jodi Flynn is a 57 y.o. female who presents to the Emergency Department complaining of laceration to the top of her head after hitting her head on a car DVD player coming out of the car about one hour ago.  Bleeding is controlled.  She denies LOC, headache, blurred vision, nausea, or vomiting.  Last tetanus is unknown.  She does not take blood thinners.     Past Medical History  Diagnosis Date  . Hx of colonic polyps 2007  . History of renal stone   . Tobacco use disorder   . Grave's disease   . History of cervical cancer     s/p hysterectomy  . Allergy   . Osteoporosis 01/2009    Bone Density 01/2009  . Wears glasses   . Cancer     CERVICAL    Past Surgical History  Procedure Laterality Date  . Abdominal hysterectomy      ovaries intact  . Colposcopy      age 51  . Colonoscopy  age 56, 12/12/05    Dr. Sheryn Bison  . Vein surgery  1/12    laser ablation left great saphenous vein , stab phlebectomy of multiple varicosities, Dr. Arbie Cookey     Family History  Problem Relation Age of Onset  . COPD Mother     respiratory failure  . Kidney disease Mother   . Hypertension Mother   . Hyperlipidemia Mother   . Heart disease Father     died of MI, late 24s  . Hypertension Father   . Hyperlipidemia Father   . Stroke Father   . Hypertension Brother   . Hyperlipidemia Brother   . Kidney disease Brother     kidney stones  . Diabetes Brother     borderline  . Cancer Neg Hx      History  Substance Use Topics  . Smoking status: Current Some Day Smoker -- 0.25 packs/day  . Smokeless tobacco: Never Used  . Alcohol Use: 1.0 oz/week    2 drink(s) per week    OB History   Grav Para Term Preterm Abortions TAB SAB Ect Mult Living                  Review of Systems  Skin: Positive for wound (laceration to top of head).  All other systems reviewed and are negative.    Allergies  Review of patient's allergies indicates no known allergies.  Home Medications   Current Outpatient Rx  Name  Route  Sig  Dispense  Refill  . alendronate (FOSAMAX) 70 MG tablet   Oral   Take 70 mg by mouth every 7 (seven) days. Takes on Monday.Take with a full glass of water on an empty stomach.         . ALPRAZolam (XANAX) 0.5 MG tablet   Oral   Take 0.5 mg by mouth 3 (three) times daily as needed for sleep or anxiety.         Marland Kitchen  calcium-vitamin D (OSCAL WITH D) 500-200 MG-UNIT per tablet   Oral   Take 1 tablet by mouth daily.          . fish oil-omega-3 fatty acids 1000 MG capsule   Oral   Take 2 g by mouth daily.          Marland Kitchen levothyroxine (SYNTHROID, LEVOTHROID) 150 MCG tablet   Oral   Take 1 tablet (150 mcg total) by mouth daily.   90 tablet   3   . Multiple Vitamin (MULTIVITAMIN WITH MINERALS) TABS   Oral   Take 1 tablet by mouth daily.           BP 155/70  Pulse 109  Temp(Src) 97.3 F (36.3 C) (Oral)  Resp 18  SpO2 96%  Physical Exam  Nursing note and vitals reviewed. Constitutional: She is oriented to person, place, and time. She appears well-developed and well-nourished. No distress.  HENT:  Head: Normocephalic and atraumatic.  Eyes: EOM are normal.  Neck: Neck supple. No tracheal deviation present.  Cardiovascular: Normal rate.   Pulmonary/Chest: Effort normal. No respiratory distress.  Musculoskeletal: Normal range of motion.  Neurological: She is alert and oriented to person, place, and time.  Skin: Skin is warm and dry.   Psychiatric: She has a normal mood and affect. Her behavior is normal.    ED Course  Procedures   DIAGNOSTIC STUDIES: Oxygen Saturation is 96% on room air, normal by my interpretation.    COORDINATION OF CARE:  5:14 PM Discussed course of care with pt which includes laceration repair.  Pt understands and agrees.    LACERATION REPAIR PROCEDURE NOTE The patient's identification was confirmed and consent was obtained. This procedure was performed by Arthor Captain, PA-C at 5:28 PM. Site: top of head in the scalp Sterile procedures observed Anesthetic used (type and amt): 2% lidocaine with epi Suture type/size: staples Length:4 cm # of Staples: 5 staples  Tetanus ordered Site anesthetized, irrigated with NS, explored without evidence of foreign body, wound well approximated, site covered with dry, sterile dressing.  Patient tolerated procedure well without complications. Instructions for care discussed verbally and patient provided with additional written instructions for homecare and f/u.  Labs Reviewed - No data to display No results found.   1. Laceration of scalp, initial encounter       MDM  Tdap booster given.Pressure irrigation performed. Laceration occurred < 8 hours prior to repair which was well tolerated. Pt has no co morbidities to effect normal wound healing. Discussed suture home care w pt and answered questions. Pt to f-u for wound check and suture removal in 7 days. Pt is hemodynamically stable w no complaints prior to dc.    I personally performed the services described in this documentation, which was scribed in my presence. The recorded information has been reviewed and is accurate.          Arthor Captain, PA-C 07/20/12 1931

## 2012-07-20 NOTE — ED Provider Notes (Signed)
Medical screening examination/treatment/procedure(s) were performed by non-physician practitioner and as supervising physician I was immediately available for consultation/collaboration.   Richardean Canal, MD 07/20/12 [id number]

## 2012-07-25 ENCOUNTER — Encounter: Payer: Self-pay | Admitting: Family Medicine

## 2012-07-25 ENCOUNTER — Ambulatory Visit (INDEPENDENT_AMBULATORY_CARE_PROVIDER_SITE_OTHER): Payer: Commercial Managed Care - PPO | Admitting: Family Medicine

## 2012-07-25 VITALS — BP 124/74 | HR 72 | Ht 65.0 in | Wt 131.0 lb

## 2012-07-25 DIAGNOSIS — Z4802 Encounter for removal of sutures: Secondary | ICD-10-CM
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
## 2012-09-02 ENCOUNTER — Telehealth: Payer: Self-pay | Admitting: Internal Medicine

## 2012-09-02 NOTE — Telephone Encounter (Signed)
Faxed medical records to [facility]

## 2012-09-19 ENCOUNTER — Other Ambulatory Visit: Payer: Self-pay | Admitting: Family Medicine

## 2012-09-23 NOTE — Telephone Encounter (Signed)
Is this ok?

## 2012-09-23 NOTE — Telephone Encounter (Signed)
This is actually Shane's pt.  At her last refill, it was noted that OV was needed (with [facility]) for any additional refills.  Deny rx.

## 2012-09-24 ENCOUNTER — Other Ambulatory Visit: Payer: Self-pay | Admitting: Family Medicine

## 2012-09-25 NOTE — Telephone Encounter (Signed)
No.  Look at refill request from 5/23--this was denied on 5/27.  Pt needs appt for refills.  This is Shane's pt, not mine (only seen once for acute visit, for this rx)

## 2012-09-25 NOTE — Telephone Encounter (Signed)
Is this okay?

## 2012-09-29 ENCOUNTER — Ambulatory Visit (INDEPENDENT_AMBULATORY_CARE_PROVIDER_SITE_OTHER): Payer: Commercial Managed Care - PPO | Admitting: Medical

## 2012-09-29 ENCOUNTER — Encounter: Payer: Self-pay | Admitting: Medical

## 2012-09-29 VITALS — BP 100/80 | HR 62 | Temp 98.4°F | Resp 16 | Wt 132.0 lb

## 2012-09-29 DIAGNOSIS — F411 Generalized anxiety disorder: Secondary | ICD-10-CM

## 2012-09-29 DIAGNOSIS — E039 Hypothyroidism, unspecified: Secondary | ICD-10-CM

## 2012-09-29 MED ORDER — ALPRAZOLAM 0.5 MG PO TABS: 0.5000 mg | ORAL_TABLET | Freq: Three times a day (TID) | ORAL | Status: DC | PRN
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
## 2012-10-06 ENCOUNTER — Telehealth: Payer: Self-pay | Admitting: Medical

## 2012-10-07 ENCOUNTER — Other Ambulatory Visit: Payer: Self-pay | Admitting: Medical

## 2012-10-07 DIAGNOSIS — M810 Age-related osteoporosis without current pathological fracture: Secondary | ICD-10-CM

## 2012-10-07 MED ORDER — LEVOTHYROXINE SODIUM 150 MCG PO TABS: 150.0000 ug | ORAL_TABLET | Freq: Every day | ORAL | Status: DC

## 2012-10-09 NOTE — Telephone Encounter (Signed)
DONE

## 2013-02-25 ENCOUNTER — Encounter: Payer: Self-pay | Admitting: Medical

## 2013-02-25 ENCOUNTER — Telehealth: Payer: Self-pay | Admitting: Family Medicine

## 2013-02-25 ENCOUNTER — Ambulatory Visit (INDEPENDENT_AMBULATORY_CARE_PROVIDER_SITE_OTHER): Payer: Commercial Managed Care - PPO | Admitting: Medical

## 2013-02-25 VITALS — BP 110/70 | HR 60 | Temp 98.0°F | Resp 16 | Ht 65.0 in | Wt 137.0 lb

## 2013-02-25 DIAGNOSIS — E039 Hypothyroidism, unspecified: Secondary | ICD-10-CM

## 2013-02-25 DIAGNOSIS — D126 Benign neoplasm of colon, unspecified: Secondary | ICD-10-CM

## 2013-02-25 DIAGNOSIS — K635 Polyp of colon: Secondary | ICD-10-CM | POA: Insufficient documentation

## 2013-02-25 DIAGNOSIS — Z1239 Encounter for other screening for malignant neoplasm of breast: Secondary | ICD-10-CM

## 2013-02-25 DIAGNOSIS — F17200 Nicotine dependence, unspecified, uncomplicated: Secondary | ICD-10-CM

## 2013-02-25 DIAGNOSIS — Z862 Personal history of diseases of the blood and blood-forming organs and certain disorders involving the immune mechanism: Secondary | ICD-10-CM

## 2013-02-25 DIAGNOSIS — Z8541 Personal history of malignant neoplasm of cervix uteri: Secondary | ICD-10-CM

## 2013-02-25 DIAGNOSIS — Z8639 Personal history of other endocrine, nutritional and metabolic disease: Secondary | ICD-10-CM

## 2013-02-25 DIAGNOSIS — Z8249 Family history of ischemic heart disease and other diseases of the circulatory system: Secondary | ICD-10-CM

## 2013-02-25 DIAGNOSIS — Z1211 Encounter for screening for malignant neoplasm of colon: Secondary | ICD-10-CM

## 2013-02-25 DIAGNOSIS — M810 Age-related osteoporosis without current pathological fracture: Secondary | ICD-10-CM

## 2013-02-25 DIAGNOSIS — Z0000 Encounter for general adult medical examination without abnormal findings: Secondary | ICD-10-CM

## 2013-02-25 LAB — CBC WITH DIFFERENTIAL/PLATELET
Basophils Relative: 1 % (ref 0–1)
Eosinophils Absolute: 0.1 10*3/uL (ref 0.0–0.7)
Eosinophils Relative: 1 % (ref 0–5)
Hemoglobin: 12.9 g/dL (ref 12.0–15.0)
MCH: 31.9 pg (ref 26.0–34.0)
MCHC: 34.4 g/dL (ref 30.0–36.0)
MCV: 92.8 fL (ref 78.0–100.0)
Monocytes Absolute: 0.4 10*3/uL (ref 0.1–1.0)
Monocytes Relative: 7 % (ref 3–12)
Neutrophils Relative %: 62 % (ref 43–77)

## 2013-02-25 LAB — LIPID PANEL
Cholesterol: 192 mg/dL (ref 0–200)
LDL Cholesterol: 103 mg/dL — ABNORMAL HIGH (ref 0–99)
Total CHOL/HDL Ratio: 2.5 Ratio
VLDL: 13 mg/dL (ref 0–40)

## 2013-02-25 LAB — COMPREHENSIVE METABOLIC PANEL
AST: 13 U/L (ref 0–37)
Albumin: 4.4 g/dL (ref 3.5–5.2)
Alkaline Phosphatase: 38 U/L — ABNORMAL LOW (ref 39–117)
BUN: 14 mg/dL (ref 6–23)
Potassium: 4.3 mEq/L (ref 3.5–5.3)
Sodium: 142 mEq/L (ref 135–145)
Total Bilirubin: 0.4 mg/dL (ref 0.3–1.2)

## 2013-02-25 LAB — POCT URINALYSIS DIPSTICK
Glucose, UA: NEGATIVE
Nitrite, UA: NEGATIVE
Urobilinogen, UA: NEGATIVE

## 2013-02-25 NOTE — Telephone Encounter (Signed)
Ret call to pt reached voice mail.  We will void check for copay for wellness visit as her ins pays 100% wellness
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
## 2013-02-25 NOTE — Patient Instructions (Signed)
Please check your insurance coverage for low-dose chest CT scan screening for lung cancer. You should qualify for this, and I recommend you have this done.  Please go ahead and schedule followups with your gynecologist for mammogram and Pap, go ahead and get in the schedule with Dr. Jarold Motto for repeat colonoscopy.  Continue to eat healthy, exercise regularly, continue your current medications  YOU CAN QUIT SMOKING!  Talk to your medical provider about using medicines to help you quit. These include nicotine replacement gum, lozenges, or skin patches.  Consider calling 1-800-QUIT-NOW, a toll free 24/7 hotline with free counseling to help you quit.  If you are ready to quit smoking or are thinking about it, congratulations! You have chosen to help yourself be healthier and live longer! There are lots of different ways to quit smoking. Nicotine gum, nicotine patches, a nicotine inhaler, or nicotine nasal spray can help with physical craving. Hypnosis, support groups, and medicines help break the habit of smoking. TIPS TO GET OFF AND STAY OFF CIGARETTES  Learn to predict your moods. Do not let a bad situation be your excuse to have a cigarette. Some situations in your life might tempt you to have a cigarette.   Ask friends and co-workers not to smoke around you.   Make your home smoke-free.   Never have "just one" cigarette. It leads to wanting another and another. Remind yourself of your decision to quit.   On a card, make a list of your reasons for not smoking. Read it at least the same number of times a day as you have a cigarette. Tell yourself everyday, "I do not want to smoke. I choose not to smoke."   Ask someone at home or work to help you with your plan to quit smoking.   Have something planned after you eat or have a cup of coffee. Take a walk or get other exercise to perk you up. This will help to keep you from overeating.   Try a relaxation exercise to calm you down and decrease  your stress. Remember, you may be tense and nervous the first two weeks after you quit. This will pass.   Find new activities to keep your hands busy. Play with a pen, coin, or rubber band. Doodle or draw things on paper.   Brush your teeth right after eating. This will help cut down the craving for the taste of tobacco after meals. You can try mouthwash too.   Try gum, breath mints, or diet candy to keep something in your mouth.  IF YOU SMOKE AND WANT TO QUIT:  Do not stock up on cigarettes. Never buy a carton. Wait until one pack is finished before you buy another.   Never carry cigarettes with you at work or at home.   Keep cigarettes as far away from you as possible. Leave them with someone else.   Never carry matches or a lighter with you.   Ask yourself, "Do I need this cigarette or is this just a reflex?"   Bet with someone that you can quit. Put cigarette money in a piggy bank every morning. If you smoke, you give up the money. If you do not smoke, by the end of the week, you keep the money.   Keep trying. It takes 21 days to change a habit!  Document Released: 02/10/2009 Document Revised: 12/27/2010 Document Reviewed: 02/10/2009 [facility] Patient Information 2012 [place], [place].

## 2013-02-26 LAB — T4, FREE: Free T4: 1.32 ng/dL (ref 0.80–1.80)

## 2013-02-26 LAB — TSH: TSH: 0.693 u[IU]/mL (ref 0.350–4.500)

## 2013-02-27 ENCOUNTER — Other Ambulatory Visit: Payer: Self-pay | Admitting: Family Medicine

## 2013-02-27 DIAGNOSIS — M810 Age-related osteoporosis without current pathological fracture: Secondary | ICD-10-CM

## 2013-02-27 MED ORDER — LEVOTHYROXINE SODIUM 150 MCG PO TABS: 150.0000 ug | ORAL_TABLET | Freq: Every day | ORAL | Status: DC

## 2013-02-27 MED ORDER — ALENDRONATE SODIUM 70 MG PO TABS: 70.0000 mg | ORAL_TABLET | ORAL | Status: DC

## 2013-03-31 DIAGNOSIS — Z0279 Encounter for issue of other medical certificate: Secondary | ICD-10-CM

## 2013-06-26 ENCOUNTER — Encounter: Payer: Self-pay | Admitting: Internal Medicine

## 2013-06-26 LAB — HM MAMMOGRAPHY

## 2013-10-18 ENCOUNTER — Other Ambulatory Visit: Payer: Self-pay | Admitting: Medical

## 2013-11-23 ENCOUNTER — Ambulatory Visit
Admission: RE | Admit: 2013-11-23 | Discharge: 2013-11-23 | Disposition: A | Discharge status: Home / Self Care | Payer: Commercial Managed Care - PPO | Source: Ambulatory Visit | Attending: Medical | Admitting: Medical

## 2013-11-23 ENCOUNTER — Ambulatory Visit (INDEPENDENT_AMBULATORY_CARE_PROVIDER_SITE_OTHER): Payer: Commercial Managed Care - PPO | Admitting: Medical

## 2013-11-23 ENCOUNTER — Encounter: Payer: Self-pay | Admitting: Medical

## 2013-11-23 VITALS — BP 102/80 | HR 62 | Temp 97.9°F | Resp 16 | Wt 135.0 lb

## 2013-11-23 DIAGNOSIS — M25512 Pain in left shoulder: Secondary | ICD-10-CM

## 2013-11-23 DIAGNOSIS — M25519 Pain in unspecified shoulder: Secondary | ICD-10-CM

## 2013-11-23 NOTE — Progress Notes (Signed)
   Subjective:   Jodi Flynn is a 58 y.o. female presenting on 11/23/2013 with left shoulder pain  Having left shoulder problems x over a year ago, but worse recently.  Left handed.  Drives a stick shift car.  Having trouble picking heavy things up, pulling shirt up hurts really bad.  Rolling over at night to sleep really hurts.  No swelling . No popping, no grinding.  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  Was doing zumba for exercise, but this hurt so bad so she quit.   No fall, no injury, no trauma.  Pain from shoulder sometimes radiates to upper arm. No neck pain.  No numbness, tingling, weakness.  No other arm issues.    Has tried Aleve.  No night time pain unless she ends up lying on that side.  No other aggravating or relieving factors.  No other complaint.  Review of Systems ROS as in subjective      Objective:    Filed Vitals:   11/23/13 1004  BP: 102/80  Pulse: 62  Temp: 97.9 F (36.6 C)  Resp: 16     General appearance: alert, no distress, WD/WN Neck: neck nontender, normal range of motion, no mass, supple MSK: Tender on the posterior left shoulder otherwise Left shoulder nontender to palpation, pain with passive and active range of motion above 90 of shoulder flexion and abduction, otherwise nontender, negative special tests. No deformity, rest of arm unremarkable. Right arm exam unremarkable UE normal pulses Neuro: Arms normal strength, sensation, DTRs     Assessment: Encounter Diagnosis  Name Primary?  . Pain in joint, shoulder region, left Yes     Plan: We discussed her symptoms and exam findings, likely bursitis of the left shoulder region. Advised ice, rest when possible, arm sling when possible on off throughout the day, begin 2 Aleve twice a day over-the-counter, and she will go for x-ray.  Jodi Flynn was seen today for left shoulder pain.  Diagnoses and associated orders for this visit:  Pain in joint, shoulder region, left - DG Shoulder  Left; Future    Return pending xray.

## 2013-11-26 NOTE — Progress Notes (Signed)
LM to [facility] 

## 2013-11-30 ENCOUNTER — Telehealth: Payer: Self-pay | Admitting: Internal Medicine

## 2013-11-30 NOTE — Telephone Encounter (Signed)
Pt states that she would like to go to [facility] as that is where her insurance will cover she thinks. She is off August 28 so she would like to be seen there at all possible.

## 2013-12-01 NOTE — Telephone Encounter (Signed)
I'm working on the patients referral . CLS

## 2014-01-20 ENCOUNTER — Telehealth: Payer: Self-pay | Admitting: Medical

## 2014-01-20 ENCOUNTER — Other Ambulatory Visit: Payer: Self-pay

## 2014-01-20 MED ORDER — LEVOTHYROXINE SODIUM 150 MCG PO TABS: ORAL_TABLET | ORAL | Status: DC

## 2014-01-20 NOTE — Telephone Encounter (Signed)
DONE

## 2014-01-20 NOTE — Telephone Encounter (Signed)
Pt called and scheduled a cpe. She needs refills on synthroid until her appt OCT 30. Pt uses [facility] [address].

## 2014-02-09 ENCOUNTER — Encounter: Payer: Self-pay | Admitting: Internal Medicine

## 2014-02-26 ENCOUNTER — Telehealth: Payer: Self-pay | Admitting: Medical

## 2014-02-26 ENCOUNTER — Ambulatory Visit (INDEPENDENT_AMBULATORY_CARE_PROVIDER_SITE_OTHER): Payer: Commercial Managed Care - PPO | Admitting: Medical

## 2014-02-26 ENCOUNTER — Encounter: Payer: Self-pay | Admitting: Medical

## 2014-02-26 VITALS — BP 100/80 | HR 79 | Temp 98.0°F | Resp 16 | Ht 65.0 in | Wt 137.0 lb

## 2014-02-26 DIAGNOSIS — Z720 Tobacco use: Secondary | ICD-10-CM

## 2014-02-26 DIAGNOSIS — Z1211 Encounter for screening for malignant neoplasm of colon: Secondary | ICD-10-CM

## 2014-02-26 DIAGNOSIS — Z86010 Personal history of colonic polyps: Secondary | ICD-10-CM

## 2014-02-26 DIAGNOSIS — F17200 Nicotine dependence, unspecified, uncomplicated: Secondary | ICD-10-CM | POA: Insufficient documentation

## 2014-02-26 DIAGNOSIS — Z7983 Long term (current) use of bisphosphonates: Secondary | ICD-10-CM | POA: Insufficient documentation

## 2014-02-26 DIAGNOSIS — M810 Age-related osteoporosis without current pathological fracture: Secondary | ICD-10-CM | POA: Insufficient documentation

## 2014-02-26 DIAGNOSIS — Z9289 Personal history of other medical treatment: Secondary | ICD-10-CM

## 2014-02-26 DIAGNOSIS — E038 Other specified hypothyroidism: Secondary | ICD-10-CM

## 2014-02-26 DIAGNOSIS — Z0000 Encounter for general adult medical examination without abnormal findings: Secondary | ICD-10-CM

## 2014-02-26 LAB — COMPREHENSIVE METABOLIC PANEL
ALBUMIN: 4.4 g/dL (ref 3.5–5.2)
ALT: 10 U/L (ref 0–35)
AST: 14 U/L (ref 0–37)
Alkaline Phosphatase: 45 U/L (ref 39–117)
BUN: 12 mg/dL (ref 6–23)
CALCIUM: 9.4 mg/dL (ref 8.4–10.5)
CHLORIDE: 106 meq/L (ref 96–112)
CO2: 28 meq/L (ref 19–32)
Creat: 0.7 mg/dL (ref 0.50–1.10)
GLUCOSE: 89 mg/dL (ref 70–99)
POTASSIUM: 5.3 meq/L (ref 3.5–5.3)
Sodium: 142 mEq/L (ref 135–145)
Total Bilirubin: 0.4 mg/dL (ref 0.2–1.2)
Total Protein: 6.5 g/dL (ref 6.0–8.3)

## 2014-02-26 LAB — CBC
HEMATOCRIT: 40 % (ref 36.0–46.0)
HEMOGLOBIN: 13.4 g/dL (ref 12.0–15.0)
MCH: 31 pg (ref 26.0–34.0)
MCHC: 33.5 g/dL (ref 30.0–36.0)
MCV: 92.6 fL (ref 78.0–100.0)
PLATELETS: 296 10*3/uL (ref 150–400)
RBC: 4.32 MIL/uL (ref 3.87–5.11)
RDW: 13.2 % (ref 11.5–15.5)
WBC: 5.1 10*3/uL (ref 4.0–10.5)

## 2014-02-26 LAB — POCT URINALYSIS DIPSTICK
Bilirubin, UA: NEGATIVE
Blood, UA: NEGATIVE
Glucose, UA: NEGATIVE
KETONES UA: NEGATIVE
LEUKOCYTES UA: NEGATIVE
Nitrite, UA: NEGATIVE
PH UA: 7
PROTEIN UA: NEGATIVE
Spec Grav, UA: <=1.005
Urobilinogen, UA: NEGATIVE

## 2014-02-26 LAB — TSH: TSH: 0.672 u[IU]/mL (ref 0.350–4.500)

## 2014-02-26 LAB — T4, FREE: Free T4: 1.59 ng/dL (ref 0.80–1.80)

## 2014-02-26 NOTE — Progress Notes (Signed)
Subjective:   HPI  Jodi Flynn is a 58 y.o. female who presents for a complete physical.   Preventative care: Last ophthalmology visit:yes Dr. Juanda Chance Last dental visit:n/a Last colonoscopy:yes due next year Last mammogram:2015 Last gynecological exam:yes goes Dr. Pamala Hurry Last CWC:[id number] Last labs: today  Prior vaccinations: TD or Tdap:2014 Influenza:01/29/2014 Pneumococcal:2013 Shingles/Zostavax:n/a  Advanced directive:yes Health care power of attorney:yes Living will:yes  Concerns: Foot pain, right, knot.    Reviewed their medical, surgical, family, social, medication, and allergy history and updated chart as appropriate.  Past Medical History  Diagnosis Date  . Hx of colonic polyps 2007  . History of renal stone   . Tobacco use disorder   . Grave's disease   . History of cervical cancer     s/p hysterectomy  . Allergy   . Osteoporosis 01/2009    Bone Density 01/2009  . Wears glasses   . Cancer     CERVICAL  . Family history of premature coronary heart disease     Past Surgical History  Procedure Laterality Date  . Abdominal hysterectomy      ovaries intact  . Colposcopy      age 89  . Colonoscopy  age 84, 12/12/05    Dr. Verl Blalock  . Vein surgery  1/12    laser ablation left great saphenous vein , stab phlebectomy of multiple varicosities, Dr. Donnetta Hutching     History   Social History  . Marital Status: Widowed    Spouse Name: N/A    Number of Children: N/A  . Years of Education: N/A   Occupational History  . billing clerk for [employer]  . Smoking status: Current Some Day Smoker -- 0.50 packs/day for 40 years  . Smokeless tobacco: Never Used  . Alcohol Use: 1.0 oz/week    2 drink(s) per week  . Drug Use: No  . Sexual Activity: Yes   Other Topics Concern  . Not on file   Social History Narrative   Widowed in 2010. 2 children, walks for exercise, hobbies: puzzles.  Boyfriend of 2 years committed  suicide (in front of her) 05/2012    Family History  Problem Relation Age of Onset  . COPD Mother     respiratory failure  . Kidney disease Mother   . Hypertension Mother   . Hyperlipidemia Mother   . Heart disease Father     died of MI, late 23s  . Hypertension Father   . Hyperlipidemia Father   . Stroke Father   . Hypertension Brother   . Hyperlipidemia Brother   . Kidney disease Brother     kidney stones  . Diabetes Brother     borderline  . Cancer Neg Hx     Current outpatient prescriptions:alendronate (FOSAMAX) 70 MG tablet, Take 1 tablet (70 mg total) by mouth every 7 (seven) days. Takes on Monday.Take with a full glass of water on an empty stomach., Disp: 4 tablet, Rfl: 11;  calcium-vitamin D (OSCAL WITH D) 500-200 MG-UNIT per tablet, Take 1 tablet by mouth daily. , Disp: , Rfl: ;  fish oil-omega-3 fatty acids 1000 MG capsule, Take 2 g by mouth daily. , Disp: , Rfl:  levothyroxine (SYNTHROID, LEVOTHROID) 150 MCG tablet, TAKE ONE TABLET BY MOUTH ONCE DAILY, Disp: 30 tablet, Rfl: 0;  Multiple Vitamin (MULTIVITAMIN WITH MINERALS) TABS, Take 1 tablet by mouth daily., Disp: , Rfl:   No Known Allergies   Review of  Systems Constitutional: -fever, -chills, -sweats, -unexpected weight change, -decreased appetite, -fatigue Allergy: -sneezing, -itching, -congestion Dermatology: +changing moles, --rash, -lumps ENT: -runny nose, -ear pain, -sore throat, -hoarseness, -sinus pain, -teeth pain, - ringing in ears, -hearing loss, -nosebleeds Cardiology: -chest pain, -palpitations, -swelling, -difficulty breathing when lying flat, -waking up short of breath Respiratory: -cough, -shortness of breath, -difficulty breathing with exercise or exertion, -wheezing, -coughing up blood Gastroenterology: -abdominal pain, -nausea, -vomiting, -diarrhea, -constipation, -blood in stool, -changes in bowel movement, -difficulty swallowing or eating Hematology: -bleeding, -bruising  Musculoskeletal: -joint  aches, -muscle aches, -joint swelling, -back pain, -neck pain, -cramping, -changes in gait Ophthalmology: denies vision changes, eye redness, itching, discharge Urology: -burning with urination, -difficulty urinating, -blood in urine, -urinary frequency, -urgency, -incontinence Neurology: -headache, -weakness, -tingling, -numbness, -memory loss, -falls, -dizziness Psychology: -depressed mood, -agitation, -sleep problems     Objective:   Physical Exam  BP 100/80  Pulse 79  Temp(Src) 98 F (36.7 C) (Oral)  Resp 16  Ht 5\' 5"  (1.651 m)  Wt 137 lb (62.143 kg)  BMI 22.80 kg/m2  General appearance: alert, no distress, WD/WN, lean white female  Skin: right preauricular region with 2 spearate raised flesh colored 31mm papules, left ear tragus with vertical fleshy appendage skin that she notes has been unchanged for years, solar keratosis of bilat forearms and hands  HEENT: normocephalic, conjunctiva/corneas normal, sclerae anicteric, PERRLA, EOMi, nares patent, no discharge or erythema, pharynx normal  Oral cavity: MMM, tongue normal, teeth in good repair  Neck: supple, no lymphadenopathy, no thyromegaly, no masses, normal ROM, no bruits  Chest: non tender, normal shape and expansion  Heart: RRR, normal S1, S2, no murmurs  Lungs: CTA bilaterally, no wheezes, rhonchi, or rales  Abdomen: +bs, soft, non tender, non distended, no masses, no hepatomegaly, no splenomegaly, no bruits  Back: non tender, normal ROM, no scoliosis  Musculoskeletal: mild bony arthritic changes of bilat 5th finger PIP, right 4th toe MTP with bony arthirits change, otherwise upper extremities non tender, no obvious deformity, normal ROM throughout, lower extremities non tender, no obvious deformity, normal ROM throughout  Extremities: no edema, no cyanosis, no clubbing  Pulses: 2+ symmetric, upper and lower extremities, normal cap refill  Neurological: alert, oriented x 3, CN2-12 intact, strength normal upper extremities and  lower extremities, sensation normal throughout, DTRs 2+ throughout, no cerebellar signs, gait normal  Psychiatric: normal affect, behavior normal, pleasant  Breast/gyn/rectal - deferred to gyn  Assessment and Plan :    Encounter Diagnoses  Name Primary?  . Encounter for health maintenance examination in adult Yes  . Osteoporosis   . H/O ongoing treatment with alendronate (Fosamax)   . Other specified hypothyroidism   . Smoker   . Screen for colon cancer   . History of colon polyps     Physical exam - discussed healthy lifestyle, diet, exercise, preventative care, vaccinations, and addressed their concerns.  Handout given. Osteoporosis - compliant with Fosamax, reviewed 2012 bone density exam, set up for repeat bone density exam Hypothyroidism - labs today, c/t current medication Smoker - is in smoking cessation classes She plans to do repeat colonoscopy after the first of the year.  Is currently due Discussed skin surveillance, consider dermatology eval next year, just had one done with dermatology screen at work, full body screen Consider CT chest for lung cancer screen F/u with eye doctor, dentist, gyn yearly Follow-up pending studies

## 2014-02-26 NOTE — Telephone Encounter (Signed)
I fax over her referral to [facility] for her repeat Bone density

## 2014-02-26 NOTE — Telephone Encounter (Signed)
Set up repeat one density scan

## 2014-02-27 LAB — VITAMIN D 25 HYDROXY (VIT D DEFICIENCY, FRACTURES): Vit D, 25-Hydroxy: 49 ng/mL (ref 30–89)

## 2014-03-01 ENCOUNTER — Other Ambulatory Visit: Payer: Self-pay | Admitting: Medical

## 2014-03-01 MED ORDER — LEVOTHYROXINE SODIUM 150 MCG PO TABS: ORAL_TABLET | ORAL | Status: DC

## 2014-03-03 ENCOUNTER — Other Ambulatory Visit: Payer: Self-pay | Admitting: Medical

## 2014-03-09 LAB — HM DEXA SCAN

## 2014-03-16 ENCOUNTER — Telehealth: Payer: Self-pay | Admitting: Medical

## 2014-03-16 NOTE — Telephone Encounter (Signed)
pls pull paper chart or print prior Bone Density results so I can compare with the recent test.

## 2014-03-16 NOTE — Telephone Encounter (Signed)
Got the one out of the chart from 02/22/11

## 2014-03-16 NOTE — Telephone Encounter (Signed)
Gave to you Jodi Flynn

## 2014-03-17 ENCOUNTER — Other Ambulatory Visit: Payer: Self-pay | Admitting: Medical

## 2014-03-17 MED ORDER — IBANDRONATE SODIUM 150 MG PO TABS: 150.0000 mg | ORAL_TABLET | ORAL | Status: DC

## 2014-03-17 NOTE — Telephone Encounter (Signed)
Please call [facility] and make sure that the recent bone density scan was compared to the 2012 bone density scan.  If not then this needs to be done, as I need to make a decision on her medication

## 2014-03-17 NOTE — Telephone Encounter (Signed)
The newer bone density scan shows no major change in bone mineral density.      1) make sure she is getting at least 1000 IU of Vit D daily and at least 1200mg  of calcium daily with diet and supplements 2) in may be worth while changing to once monthly Boniva to see if this works better.  We wouldn't know for another 2 years after taking Boniva for 2 years and repeating Bone Density scan at that time.   3) send her info on osteoporosis and preventative measures. 4) I sent Boniva to pharmacy.  Once she gets this, lets change to taking Boniva once a month in the same way upright on empty stomach 1 hour before meals, but this one is just once monthly.

## 2014-03-17 NOTE — Telephone Encounter (Signed)
I spoke with the staff at [facility] and they state that the readings was compared from 2015 to the 2012 bone density scan

## 2014-03-18 ENCOUNTER — Encounter: Payer: Self-pay | Admitting: Medical

## 2014-03-18 MED ORDER — IBANDRONATE SODIUM 150 MG PO TABS: ORAL_TABLET | ORAL | Status: DC

## 2014-03-18 NOTE — Telephone Encounter (Signed)
Pt notified of shane recommendations and want meds to take shane- please get me info that you want me to send to pt for osteoporosis, as i am not sure where that is

## 2014-03-18 NOTE — Telephone Encounter (Signed)
LMOM AT HER JOB. CLS

## 2014-03-18 NOTE — Telephone Encounter (Signed)
I printed a good handout from [facility].   Please mail it to her.

## 2014-03-18 NOTE — Progress Notes (Signed)
Sent med to pharmacy  

## 2014-03-19 NOTE — Telephone Encounter (Signed)
I mailed Ms. Martinique the information on Osteoporosis per Chana Bode [facility]

## 2014-03-23 ENCOUNTER — Encounter: Payer: Self-pay | Admitting: Internal Medicine

## 2014-03-23 ENCOUNTER — Encounter: Payer: Self-pay | Admitting: Medical

## 2014-04-01 ENCOUNTER — Encounter: Payer: Self-pay | Admitting: Medical

## 2014-04-05 ENCOUNTER — Other Ambulatory Visit: Payer: Self-pay | Admitting: Family Medicine

## 2014-04-05 DIAGNOSIS — Z0000 Encounter for general adult medical examination without abnormal findings: Secondary | ICD-10-CM

## 2014-04-08 ENCOUNTER — Other Ambulatory Visit: Payer: Commercial Managed Care - PPO

## 2014-04-08 DIAGNOSIS — Z0000 Encounter for general adult medical examination without abnormal findings: Secondary | ICD-10-CM

## 2014-04-08 LAB — LIPID PANEL
CHOL/HDL RATIO: 2.5 ratio
Cholesterol: 175 mg/dL (ref 0–200)
HDL: 71 mg/dL (ref >39)
LDL CALC: 91 mg/dL (ref 0–99)
Triglycerides: 67 mg/dL (ref <150)
VLDL: 13 mg/dL (ref 0–40)

## 2014-04-12 ENCOUNTER — Encounter: Payer: Self-pay | Admitting: Internal Medicine

## 2014-07-08 LAB — HM PAP SMEAR: HM Pap smear: NEGATIVE

## 2014-07-13 LAB — HM PAP SMEAR: HM Pap smear: NEGATIVE

## 2014-12-17 ENCOUNTER — Other Ambulatory Visit: Payer: Self-pay | Admitting: Medical

## 2014-12-20 NOTE — Telephone Encounter (Signed)
lmtcb needs appointment

## 2014-12-21 NOTE — Telephone Encounter (Signed)
Pt called and made an appt for sept.  please refill meds.

## 2014-12-21 NOTE — Telephone Encounter (Signed)
Jodi Flynn i have called and left message for pt to call back

## 2015-01-10 ENCOUNTER — Encounter: Payer: Self-pay | Admitting: Medical

## 2015-01-10 ENCOUNTER — Ambulatory Visit (INDEPENDENT_AMBULATORY_CARE_PROVIDER_SITE_OTHER): Payer: Commercial Managed Care - PPO | Admitting: Medical

## 2015-01-10 VITALS — BP 128/80 | HR 66 | Temp 98.3°F | Resp 18

## 2015-01-10 DIAGNOSIS — M810 Age-related osteoporosis without current pathological fracture: Secondary | ICD-10-CM | POA: Diagnosis not present

## 2015-01-10 DIAGNOSIS — Z7189 Other specified counseling: Secondary | ICD-10-CM

## 2015-01-10 DIAGNOSIS — E038 Other specified hypothyroidism: Secondary | ICD-10-CM

## 2015-01-10 DIAGNOSIS — Z720 Tobacco use: Secondary | ICD-10-CM

## 2015-01-10 DIAGNOSIS — Z1211 Encounter for screening for malignant neoplasm of colon: Secondary | ICD-10-CM | POA: Diagnosis not present

## 2015-01-10 DIAGNOSIS — F17200 Nicotine dependence, unspecified, uncomplicated: Secondary | ICD-10-CM

## 2015-01-10 DIAGNOSIS — Z7185 Encounter for immunization safety counseling: Secondary | ICD-10-CM | POA: Insufficient documentation

## 2015-01-10 LAB — TSH: TSH: 0.956 u[IU]/mL (ref 0.350–4.500)

## 2015-01-10 LAB — T4, FREE: Free T4: 1.32 ng/dL (ref 0.80–1.80)

## 2015-01-10 NOTE — Progress Notes (Signed)
   Subjective: Chief Complaint  Patient presents with  . thyroid   Here for f/u mainly on hypothyroidism.   Due for labs. Compliant with medication without c/o.  Compliant with Boniva for osteoporosis.  Exercising, getting weight bearing exercise.  Taking Vit D.  Just got back from [place] vacation with her girlfriends.   Had great time, but knows her diet was not the best.  otherwise feels fine.  Gets flu shot at work free yearly.  Past Medical History  Diagnosis Date  . Hx of colonic polyps 2007  . History of renal stone   . Tobacco use disorder   . Grave's disease   . History of cervical cancer     s/p hysterectomy  . Allergy   . Osteoporosis 01/2009    Bone Density 01/2009  . Wears glasses   . Family history of premature coronary heart disease    ROS as in subjective  Objective: BP 128/80 mmHg  Pulse 66  Temp(Src) 98.3 F (36.8 C) (Oral)  Resp 18  General appearance: alert, no distress, WD/WN Neck: supple, no lymphadenopathy, no thyromegaly, no masses Heart: RRR, normal S1, S2, no murmurs Lungs: CTA bilaterally, no wheezes, rhonchi, or rales Abdomen: +bs, soft, non tender, non distended, no masses, no hepatomegaly, no splenomegaly Pulses: 2+ symmetric, upper and lower extremities, normal cap refill Ext no edema    Assessment: Encounter Diagnoses  Name Primary?  . Other specified hypothyroidism Yes  . Smoker   . Osteoporosis   . Vaccine counseling   . Special screening for malignant neoplasms, colon     Plan: Hypothyroidism - c/t same medication, labs today Smoker - again reiterated need to stop.  She has cut down some, not ready to fully give up.  discussed risks of tobacco use osteoporosis - compliant with Boniva She will get flu shot at work. She is due for colonoscopy but trying to coordinate with her daughter for transportation.    She will let me know about referral.

## 2015-01-10 NOTE — Addendum Note (Signed)
Addended by: Carlena Hurl on: 01/10/2015 11:09 AM   Modules accepted: Level of Service

## 2015-01-11 ENCOUNTER — Encounter: Payer: Commercial Managed Care - PPO | Admitting: Medical

## 2015-01-11 ENCOUNTER — Other Ambulatory Visit: Payer: Self-pay | Admitting: Medical

## 2015-01-11 MED ORDER — LEVOTHYROXINE SODIUM 150 MCG PO TABS: 150.0000 ug | ORAL_TABLET | Freq: Every day | ORAL | Status: DC

## 2015-01-11 MED ORDER — IBANDRONATE SODIUM 150 MG PO TABS: ORAL_TABLET | ORAL | Status: DC

## 2015-01-19 ENCOUNTER — Encounter: Payer: Self-pay | Admitting: Family Medicine

## 2015-01-19 ENCOUNTER — Ambulatory Visit (INDEPENDENT_AMBULATORY_CARE_PROVIDER_SITE_OTHER): Payer: Commercial Managed Care - PPO | Admitting: Family Medicine

## 2015-01-19 VITALS — BP 110/70 | HR 68 | Temp 98.2°F | Wt 136.2 lb

## 2015-01-19 DIAGNOSIS — J029 Acute pharyngitis, unspecified: Secondary | ICD-10-CM

## 2015-01-19 DIAGNOSIS — R5383 Other fatigue: Secondary | ICD-10-CM

## 2015-01-19 DIAGNOSIS — Z720 Tobacco use: Secondary | ICD-10-CM

## 2015-01-19 DIAGNOSIS — F17200 Nicotine dependence, unspecified, uncomplicated: Secondary | ICD-10-CM

## 2015-01-19 LAB — POCT RAPID STREP A (OFFICE): RAPID STREP A SCREEN: NEGATIVE

## 2015-01-19 MED ORDER — AMOXICILLIN 500 MG PO CAPS: 500.0000 mg | ORAL_CAPSULE | Freq: Two times a day (BID) | ORAL | Status: DC

## 2015-01-19 NOTE — Patient Instructions (Addendum)
Your strep test today was negative Hydrate and rest. Warm saltwater gargles and warm liquids for your throat. You can also use lozenges or a throat spray if needed. Take Tylenol for mild fever, body aches, malaise. If you're not  better when you finish the antibiotic, give [place] a call.   Pharyngitis Pharyngitis is redness, pain, and swelling (inflammation) of your pharynx.  CAUSES  Pharyngitis is usually caused by infection. Most of the time, these infections are from viruses (viral) and are part of a cold. However, sometimes pharyngitis is caused by bacteria (bacterial). Pharyngitis can also be caused by allergies. Viral pharyngitis may be spread from person to person by coughing, sneezing, and personal items or utensils (cups, forks, spoons, toothbrushes). Bacterial pharyngitis may be spread from person to person by more intimate contact, such as kissing.  SIGNS AND SYMPTOMS  Symptoms of pharyngitis include:   Sore throat.   Tiredness (fatigue).   Low-grade fever.   Headache.  Joint pain and muscle aches.  Skin rashes.  Swollen lymph nodes.  Plaque-like film on throat or tonsils (often seen with bacterial pharyngitis). DIAGNOSIS  Your health care provider will ask you questions about your illness and your symptoms. Your medical history, along with a physical exam, is often all that is needed to diagnose pharyngitis. Sometimes, a rapid strep test is done. Other lab tests may also be done, depending on the suspected cause.  TREATMENT  Viral pharyngitis will usually get better in 3-4 days without the use of medicine. Bacterial pharyngitis is treated with medicines that kill germs (antibiotics).  HOME CARE INSTRUCTIONS   Drink enough water and fluids to keep your urine clear or pale yellow.   Only take over-the-counter or prescription medicines as directed by your health care provider:   If you are prescribed antibiotics, make sure you finish them even if you start to feel  better.   Do not take aspirin.   Get lots of rest.   Gargle with 8 oz of salt water ( tsp of salt per 1 qt of water) as often as every 1-2 hours to soothe your throat.   Throat lozenges (if you are not at risk for choking) or sprays may be used to soothe your throat. SEEK MEDICAL CARE IF:   You have large, tender lumps in your neck.  You have a rash.  You cough up green, yellow-brown, or bloody spit. SEEK IMMEDIATE MEDICAL CARE IF:   Your neck becomes stiff.  You drool or are unable to swallow liquids.  You vomit or are unable to keep medicines or liquids down.  You have severe pain that does not go away with the use of recommended medicines.  You have trouble breathing (not caused by a stuffy nose). MAKE SURE YOU:   Understand these instructions.  Will watch your condition.  Will get help right away if you are not doing well or get worse. Document Released: 04/16/2005 Document Revised: 02/04/2013 Document Reviewed: 12/22/2012 [facility] Patient Information 2015 [place], [place]. This information is not intended to replace advice given to you by your health care provider. Make sure you discuss any questions you have with your health care provider.

## 2015-01-19 NOTE — Progress Notes (Signed)
   Subjective:    [name], female    DOB: 30-Oct-1955, 59 y.o.   MRN: [id number]  HPI She is here for a 6 day history of malaise, sore throat, productive cough, chills, and sinus pressure. She reports she just returned from a cruise in her symptoms started abruptly. She states nasal congestion and sinus pressure are improving however her sore throat is getting worse and states she feels like her lymph nodes in her neck are swollen. She states she noted white patches in her throat this morning. She does smoke and is not ready to quit. Denies dizziness, chest pain, shortness of breath, n/v/d   Review of Systems Pertinent positives and negatives in the history of present illness.    Objective:   Physical Exam Alert and in mild distress. Sinuses non tender. Nares patent. Tympanic membranes and canals are normal. Pharyngeal area is erythematous and edematous with white patches noted to bilateral tonsils. Neck is supple with left cervical adenopathy and tenderness. Cardiac exam shows a regular sinus rhythm without murmurs or gallops. Lungs are clear to auscultation.   Rapid strep negative     Assessment & Plan:  Acute pharyngitis, unspecified pharyngitis type - Plan: POCT rapid strep A  Other fatigue  Discussed that she should treat her symptoms, such as warm saltwater gargles, throat sprays or lozenges, use Tylenol for fever and malaise and let me know if she is not back to baseline after completing the antibiotic.

## 2015-02-18 ENCOUNTER — Encounter: Payer: Self-pay | Admitting: Medical

## 2015-02-18 ENCOUNTER — Ambulatory Visit (INDEPENDENT_AMBULATORY_CARE_PROVIDER_SITE_OTHER): Payer: Commercial Managed Care - PPO | Admitting: Medical

## 2015-02-18 VITALS — BP 118/82 | Temp 98.1°F | Ht 65.0 in | Wt 134.0 lb

## 2015-02-18 DIAGNOSIS — Z7189 Other specified counseling: Secondary | ICD-10-CM | POA: Diagnosis not present

## 2015-02-18 DIAGNOSIS — Z720 Tobacco use: Secondary | ICD-10-CM

## 2015-02-18 DIAGNOSIS — F17200 Nicotine dependence, unspecified, uncomplicated: Secondary | ICD-10-CM | POA: Diagnosis not present

## 2015-02-18 DIAGNOSIS — M810 Age-related osteoporosis without current pathological fracture: Secondary | ICD-10-CM | POA: Diagnosis not present

## 2015-02-18 DIAGNOSIS — Z0000 Encounter for general adult medical examination without abnormal findings: Secondary | ICD-10-CM

## 2015-02-18 DIAGNOSIS — Z8249 Family history of ischemic heart disease and other diseases of the circulatory system: Secondary | ICD-10-CM | POA: Diagnosis not present

## 2015-02-18 DIAGNOSIS — K635 Polyp of colon: Secondary | ICD-10-CM | POA: Diagnosis not present

## 2015-02-18 DIAGNOSIS — Z8639 Personal history of other endocrine, nutritional and metabolic disease: Secondary | ICD-10-CM | POA: Diagnosis not present

## 2015-02-18 DIAGNOSIS — E038 Other specified hypothyroidism: Secondary | ICD-10-CM

## 2015-02-18 DIAGNOSIS — R8290 Unspecified abnormal findings in urine: Secondary | ICD-10-CM

## 2015-02-18 DIAGNOSIS — Z7185 Encounter for immunization safety counseling: Secondary | ICD-10-CM

## 2015-02-18 LAB — COMPREHENSIVE METABOLIC PANEL
ALK PHOS: 42 U/L (ref 33–130)
ALT: 14 U/L (ref 6–29)
AST: 17 U/L (ref 10–35)
Albumin: 4 g/dL (ref 3.6–5.1)
BILIRUBIN TOTAL: 0.5 mg/dL (ref 0.2–1.2)
BUN: 12 mg/dL (ref 7–25)
CO2: 27 mmol/L (ref 20–31)
Calcium: 9.5 mg/dL (ref 8.6–10.4)
Chloride: 104 mmol/L (ref 98–110)
Creat: 0.68 mg/dL (ref 0.50–0.99)
GLUCOSE: 83 mg/dL (ref 65–99)
Potassium: 5 mmol/L (ref 3.5–5.3)
Sodium: 140 mmol/L (ref 135–146)
TOTAL PROTEIN: 6.7 g/dL (ref 6.1–8.1)

## 2015-02-18 LAB — POCT URINALYSIS DIPSTICK
Bilirubin, UA: NEGATIVE
GLUCOSE UA: NEGATIVE
KETONES UA: NEGATIVE
Nitrite, UA: NEGATIVE
Protein, UA: NEGATIVE
SPEC GRAV UA: 1.02
Urobilinogen, UA: NEGATIVE
pH, UA: 6

## 2015-02-18 LAB — CBC
HCT: 38.9 % (ref 36.0–46.0)
Hemoglobin: 13.2 g/dL (ref 12.0–15.0)
MCH: 31.7 pg (ref 26.0–34.0)
MCHC: 33.9 g/dL (ref 30.0–36.0)
MCV: 93.5 fL (ref 78.0–100.0)
MPV: 9.3 fL (ref 8.6–12.4)
PLATELETS: 287 10*3/uL (ref 150–400)
RBC: 4.16 MIL/uL (ref 3.87–5.11)
RDW: 13.8 % (ref 11.5–15.5)
WBC: 5.4 10*3/uL (ref 4.0–10.5)

## 2015-02-18 NOTE — Patient Instructions (Signed)
Recommendations:  Check insurance coverage for lung cancer CT screen  Check insurance coverage for shingles vaccine.  STOP TOBACCO!  We can help with medications if needed   YOU CAN QUIT SMOKING!  Talk to your medical provider about using medicines to help you quit. These include nicotine replacement gum, lozenges, or skin patches.  Consider calling 1-800-QUIT-NOW, a toll free 24/7 hotline with free counseling to help you quit.  If you are ready to quit smoking or are thinking about it, congratulations! You have chosen to help yourself be healthier and live longer! There are lots of different ways to quit smoking. Nicotine gum, nicotine patches, a nicotine inhaler, or nicotine nasal spray can help with physical craving. Hypnosis, support groups, and medicines help break the habit of smoking. TIPS TO GET OFF AND STAY OFF CIGARETTES  Learn to predict your moods. Do not let a bad situation be your excuse to have a cigarette. Some situations in your life might tempt you to have a cigarette.   Ask friends and co-workers not to smoke around you.   Make your home smoke-free.   Never have "just one" cigarette. It leads to wanting another and another. Remind yourself of your decision to quit.   On a card, make a list of your reasons for not smoking. Read it at least the same number of times a day as you have a cigarette. Tell yourself everyday, "I do not want to smoke. I choose not to smoke."   Ask someone at home or work to help you with your plan to quit smoking.   Have something planned after you eat or have a cup of coffee. Take a walk or get other exercise to perk you up. This will help to keep you from overeating.   Try a relaxation exercise to calm you down and decrease your stress. Remember, you may be tense and nervous the first two weeks after you quit. This will pass.   Find new activities to keep your hands busy. Play with a pen, coin, or rubber band. Doodle or draw things on  paper.   Brush your teeth right after eating. This will help cut down the craving for the taste of tobacco after meals. You can try mouthwash too.   Try gum, breath mints, or diet candy to keep something in your mouth.  IF YOU SMOKE AND WANT TO QUIT:  Do not stock up on cigarettes. Never buy a carton. Wait until one pack is finished before you buy another.   Never carry cigarettes with you at work or at home.   Keep cigarettes as far away from you as possible. Leave them with someone else.   Never carry matches or a lighter with you.   Ask yourself, "Do I need this cigarette or is this just a reflex?"   Bet with someone that you can quit. Put cigarette money in a piggy bank every morning. If you smoke, you give up the money. If you do not smoke, by the end of the week, you keep the money.   Keep trying. It takes 21 days to change a habit!  Document Released: 02/10/2009 Document Revised: 12/27/2010 Document Reviewed: 02/10/2009 [facility] Patient Information 2012 [place].
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
## 2015-02-18 NOTE — Addendum Note (Signed)
Addended by: Carlena Hurl on: 02/18/2015 12:00 PM   Modules accepted: Orders

## 2015-02-20 LAB — URINE CULTURE: Colony Count: 50000

## 2015-02-28 ENCOUNTER — Encounter: Payer: Self-pay | Admitting: Medical

## 2015-03-31 ENCOUNTER — Ambulatory Visit (INDEPENDENT_AMBULATORY_CARE_PROVIDER_SITE_OTHER): Payer: Commercial Managed Care - PPO | Admitting: Family Medicine

## 2015-03-31 ENCOUNTER — Ambulatory Visit (INDEPENDENT_AMBULATORY_CARE_PROVIDER_SITE_OTHER): Payer: Commercial Managed Care - PPO

## 2015-03-31 VITALS — BP 116/72 | HR 67 | Temp 98.1°F | Resp 18

## 2015-03-31 DIAGNOSIS — M79671 Pain in right foot: Secondary | ICD-10-CM

## 2015-03-31 DIAGNOSIS — S92301A Fracture of unspecified metatarsal bone(s), right foot, initial encounter for closed fracture: Secondary | ICD-10-CM

## 2015-03-31 MED ORDER — HYDROCODONE-ACETAMINOPHEN 5-325 MG PO TABS: 1.0000 | ORAL_TABLET | Freq: Four times a day (QID) | ORAL | Status: DC | PRN

## 2015-03-31 NOTE — Patient Instructions (Signed)
Please return in 2 weeks so we can recheck the healing and alignment of the bone
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
## 2015-04-14 ENCOUNTER — Ambulatory Visit (INDEPENDENT_AMBULATORY_CARE_PROVIDER_SITE_OTHER): Payer: Commercial Managed Care - PPO

## 2015-04-14 ENCOUNTER — Ambulatory Visit (INDEPENDENT_AMBULATORY_CARE_PROVIDER_SITE_OTHER): Payer: Commercial Managed Care - PPO | Admitting: Family Medicine

## 2015-04-14 VITALS — BP 110/64 | HR 72 | Temp 98.7°F | Resp 14 | Ht 65.0 in | Wt 128.0 lb

## 2015-04-14 DIAGNOSIS — S92901D Unspecified fracture of right foot, subsequent encounter for fracture with routine healing: Secondary | ICD-10-CM

## 2015-04-14 NOTE — Progress Notes (Signed)
By signing my name below, I, Rawaa Al Rifaie, attest that this documentation has been prepared under the direction and in the presence of Jodi Flynn, [place], Medical Scribe. 04/14/2015.  8:40 AM.     [name] MRN: [id number], DOB: [birth year], 59 y.o. Date of Encounter: 04/14/2015  Primary Physician: Crisoforo Oxford, PA-C  Chief Complaint:  Chief Complaint  Patient presents with  . Follow-up    RIght foot injury    HPI:  Jodi Flynn is a 59 y.o. female who presents to [facility] for a follow up regarding her right foot injury.  Pt was seen here on 12/01 by me for an injury that occurred the night before. She had a metatarsal fx and was given a cam walker for alignment and healing. Today. she notes that her leg is healing well, she states however that the area is still mildly sore, and still present with mild swelling.   Past Medical History  Diagnosis Date  . Hx of colonic polyps 2007  . History of renal stone   . Tobacco use disorder   . History of cervical cancer     s/p hysterectomy  . Allergy   . Wears glasses   . Family history of premature coronary heart disease   . Grave's disease   . Osteoporosis 01/2009    Bone Density 02/2014 no change in density, thus changed from Fosamax to [place]: Prior to Admission medications   Medication Sig Start Date End Date Taking? Authorizing Provider  calcium-vitamin D (OSCAL WITH D) 500-200 MG-UNIT per tablet Take 1 tablet by mouth daily.    Yes Historical Provider, MD  Cyanocobalamin (VITAMIN B 12 PO) Take by mouth.   Yes Historical Provider, MD  fish oil-omega-3 fatty acids 1000 MG capsule Take 2 g by mouth daily.    Yes Historical Provider, MD  ibandronate (BONIVA) 150 MG tablet Take 1 Q30 days in the am with a full glass of water on empty stomach, & do not take anything else by mouth or lie down for the next 30 min. 01/11/15  Yes Camelia Eng  Tysinger, PA-C  levothyroxine (SYNTHROID, LEVOTHROID) 150 MCG tablet Take 1 tablet (150 mcg total) by mouth daily. 01/11/15  Yes Camelia Eng Tysinger, PA-C  Multiple Vitamin (MULTIVITAMIN WITH MINERALS) TABS Take 1 tablet by mouth daily.   Yes Historical Provider, MD  HYDROcodone-acetaminophen (NORCO) 5-325 MG tablet Take 1 tablet by mouth every 6 (six) hours as needed. Patient not taking: Reported on 04/14/2015 03/31/15   Jodi Haber, MD    Allergies: No Known Allergies  Social History   Social History  . Marital Status: Widowed    Spouse Name: N/A  . Number of Children: N/A  . Years of Education: N/A   Occupational History  . 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 Smoking status: Current Some Day Smoker -- 0.50 packs/day for 40 years  . Smokeless tobacco: Never Used  . Alcohol Use: 1.0 oz/week    2 Standard drinks or equivalent per week  . Drug Use: No  . Sexual Activity: Yes   Other Topics Concern  . Not on file   Social History Narrative   Widowed in 2010. 2 children, walks for exercise, hobbies: puzzles.  Boyfriend of 2 years committed suicide (in front of her) 05/2012     Review of Systems: Constitutional: negative  for chills, fever, night sweats, weight changes, or fatigue  HEENT: negative for vision changes, hearing loss, congestion, rhinorrhea, ST, epistaxis, or sinus pressure Cardiovascular: negative for chest pain or palpitations Respiratory: negative for hemoptysis, wheezing, shortness of breath, or cough Abdominal: negative for abdominal pain, nausea, vomiting, diarrhea, or constipation Dermatological: negative for rash Neurologic: negative for headache, dizziness, or syncope Msk: Positive for arthralgia, and joint swelling. All other systems reviewed and are otherwise negative with the exception to those above and in the HPI.  Physical Exam: Blood pressure 110/64, pulse 72, temperature 98.7 F (37.1 C), temperature source Oral, resp.  rate 14, height 5\' 5"  (1.651 m), weight 128 lb (58.06 kg), SpO2 98 %., Body mass index is 21.3 kg/(m^2). General: Well developed, well nourished, in no acute distress. Head: Normocephalic, atraumatic, eyes without discharge, sclera non-icteric, nares are without discharge. Bilateral auditory canals clear, TM's are without perforation, pearly grey and translucent with reflective cone of light bilaterally. Oral cavity moist, posterior pharynx without exudate, erythema, peritonsillar abscess, or post nasal drip.  Neck: Supple. No thyromegaly. Full ROM. No lymphadenopathy. Lungs: Clear bilaterally to auscultation without wheezes, rales, or rhonchi. Breathing is unlabored. Heart: RRR with S1 S2. No murmurs, rubs, or gallops appreciated. Msk:  Strength and tone normal for age. Extremities/Skin: Warm and dry. No clubbing or cyanosis. No edema. No rashes or suspicious lesions. Fainting ecchymosis along the lateral aspect with very minimal swelling . No bony mal alignment.  Neuro: Alert and oriented X 3. Moves all extremities spontaneously. Gait is normal. CNII-XII grossly in tact. Psych:  Responds to questions appropriately with a normal affect.    [facility] (PRIMARY) x-ray report read by Dr. Robyn Haber, MD: Stable position of fracture.   ASSESSMENT AND PLAN:  59 y.o. year old female with follow up fracture care  Return in 9 days. This chart was scribed in my presence and reviewed by me personally.    ICD-9-CM ICD-10-CM   1. Foot fracture, right, with routine healing, subsequent encounter V54.19 S92.901D DG Foot 2 Views Right     Signed, Jodi Haber, MD   Signed, Jodi Haber, MD 04/14/2015 8:37 AM

## 2015-04-22 ENCOUNTER — Ambulatory Visit (INDEPENDENT_AMBULATORY_CARE_PROVIDER_SITE_OTHER): Payer: Commercial Managed Care - PPO

## 2015-04-22 ENCOUNTER — Ambulatory Visit (INDEPENDENT_AMBULATORY_CARE_PROVIDER_SITE_OTHER): Payer: Commercial Managed Care - PPO | Admitting: Family Medicine

## 2015-04-22 VITALS — BP 108/66 | HR 94 | Temp 97.8°F | Resp 18 | Ht 65.0 in

## 2015-04-22 DIAGNOSIS — S92901S Unspecified fracture of right foot, sequela: Secondary | ICD-10-CM

## 2015-04-22 NOTE — Patient Instructions (Signed)
Use the Lasix up splint for 2 weeks more. No need to recheck unless you get more pain.

## 2015-04-22 NOTE — Progress Notes (Signed)
By signing my name below, I, Moises Blood, attest that this documentation has been prepared under the direction and in the presence of Robyn Haber, MD. Electronically Signed: Moises Blood, [place]. 04/22/2015 , 9:04 AM .  Patient was seen in room 8 .   [name] MRN: [id number], DOB: 10/27/1955, 59 y.o. Date of Encounter: 04/22/2015  Primary Physician: Crisoforo Oxford, PA-C  Chief Complaint:  Chief Complaint  Patient presents with   Follow-up    right foot    HPI:  Jodi Flynn is a 59 y.o. female who presents to [facility] follow up for her right foot.  She has a right foot fx, date of injury 11/30.   When she push her toes down, she feels some soreness on the dorsal of the right foot. Her toes feel normal.   Past Medical History  Diagnosis Date   Hx of colonic polyps 2007   History of renal stone    Tobacco use disorder    History of cervical cancer     s/p hysterectomy   Allergy    Wears glasses    Family history of premature coronary heart disease    Grave's disease    Osteoporosis 01/2009    Bone Density 02/2014 no change in density, thus changed from Fosamax to [place]: Prior to Admission medications   Medication Sig Start Date End Date Taking? Authorizing Provider  calcium-vitamin D (OSCAL WITH D) 500-200 MG-UNIT per tablet Take 1 tablet by mouth daily.     Historical Provider, MD  Cyanocobalamin (VITAMIN B 12 PO) Take by mouth.    Historical Provider, MD  fish oil-omega-3 fatty acids 1000 MG capsule Take 2 g by mouth daily.     Historical Provider, MD  HYDROcodone-acetaminophen (NORCO) 5-325 MG tablet Take 1 tablet by mouth every 6 (six) hours as needed. Patient not taking: Reported on 04/14/2015 03/31/15   Robyn Haber, MD  ibandronate (BONIVA) 150 MG tablet Take 1 Q30 days in the am with a full glass of water on empty stomach, & do not take anything else by mouth or lie down for  the next 30 min. 01/11/15   Camelia Eng Tysinger, PA-C  levothyroxine (SYNTHROID, LEVOTHROID) 150 MCG tablet Take 1 tablet (150 mcg total) by mouth daily. 01/11/15   Camelia Eng Tysinger, PA-C  Multiple Vitamin (MULTIVITAMIN WITH MINERALS) TABS Take 1 tablet by mouth daily.    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 2 children, walks for exercise, hobbies: puzzles.  Boyfriend of 2 years committed suicide (in front of her) 05/2012     Review of Systems: Constitutional: negative for fever, chills, night sweats, weight changes, or fatigue  HEENT: negative for vision changes, hearing loss, congestion, rhinorrhea, ST, epistaxis, or  sinus pressure Cardiovascular: negative for chest pain or palpitations Respiratory: negative for hemoptysis, wheezing, shortness of breath, or cough Abdominal: negative for abdominal pain, nausea, vomiting, diarrhea, or constipation Dermatological: negative for rash Neurologic: negative for headache, dizziness, or syncope Musc: positive for arthralgia (right foot)  All other systems reviewed and are otherwise negative with the exception to those above and in the HPI.  Physical Exam: Blood pressure 108/66, pulse 94, temperature 97.8 F (36.6 C), temperature source Oral, resp. rate 18, height 5\' 5"  (1.651 m), SpO2 96 %., There is no weight on file to calculate BMI. General: Well developed, well nourished, in no acute  distress. Head: Normocephalic, atraumatic, eyes without discharge, sclera non-icteric, nares are without discharge. Bilateral auditory canals clear, TM's are without perforation, pearly grey and translucent with reflective cone of light bilaterally. Oral cavity moist, posterior pharynx without exudate, erythema, peritonsillar abscess, or post nasal drip.  Neck: Supple. No thyromegaly. Full ROM. No lymphadenopathy. Lungs: Clear bilaterally to auscultation without wheezes, rales, or rhonchi. Breathing is unlabored. Heart: RRR with S1 S2. No murmurs, rubs, or gallops appreciated. Abdomen: Soft, non-tender, non-distended with normoactive bowel sounds. No hepatomegaly. No rebound/guarding. No obvious abdominal masses. Msk:  Strength and tone normal for age.  Extremities/Skin: Warm and dry. No clubbing or cyanosis. Good rom of her toes, mild swelling lateral aspect right foot, no tenderness Neuro: Alert and oriented X 3. Moves all extremities spontaneously. Gait is normal. CNII-XII grossly in tact. Psych:  Responds to questions appropriately with a normal affect.   [facility] reading (PRIMARY) by Dr. Joseph Art : right foot xray: no fx seen, good bony alignment    ASSESSMENT AND PLAN:  59 y.o. year old female with right fifth metatarsal fracture which appears to be healing well. I think it's prudent to use a brace for another 2 weeks for complete healing. She can come out of the [employer].  This chart was scribed in my presence and reviewed by me personally.   Signed, Robyn Haber, MD 04/22/2015 9:04 AM

## 2015-10-12 ENCOUNTER — Other Ambulatory Visit: Payer: Self-pay | Admitting: Medical

## 2015-10-28 ENCOUNTER — Encounter: Payer: Self-pay | Admitting: Gastroenterology

## 2016-01-17 ENCOUNTER — Telehealth: Payer: Self-pay

## 2016-01-17 NOTE — Telephone Encounter (Signed)
Pt needs refill of ibandronate 150 to [facility]

## 2016-01-17 NOTE — Telephone Encounter (Signed)
Is this okay to refill? 

## 2016-01-17 NOTE — Telephone Encounter (Signed)
Yes, refill, and please refill and get her in for physical in October when she is due

## 2016-01-18 MED ORDER — IBANDRONATE SODIUM 150 MG PO TABS: ORAL_TABLET | ORAL | 0 refills | Status: DC

## 2016-01-18 NOTE — Telephone Encounter (Signed)
Called and left message for pt to call and schedule cpe for October and I have refilled her med

## 2016-02-23 ENCOUNTER — Encounter: Payer: Self-pay | Admitting: Gastroenterology

## 2016-02-23 ENCOUNTER — Ambulatory Visit (INDEPENDENT_AMBULATORY_CARE_PROVIDER_SITE_OTHER): Payer: Managed Care, Other (non HMO) | Admitting: Medical

## 2016-02-23 ENCOUNTER — Encounter: Payer: Self-pay | Admitting: Medical

## 2016-02-23 VITALS — BP 122/70 | HR 80 | Resp 18 | Ht 64.25 in | Wt 124.2 lb

## 2016-02-23 DIAGNOSIS — R3129 Other microscopic hematuria: Secondary | ICD-10-CM

## 2016-02-23 DIAGNOSIS — Z1239 Encounter for other screening for malignant neoplasm of breast: Secondary | ICD-10-CM

## 2016-02-23 DIAGNOSIS — E038 Other specified hypothyroidism: Secondary | ICD-10-CM | POA: Diagnosis not present

## 2016-02-23 DIAGNOSIS — R0989 Other specified symptoms and signs involving the circulatory and respiratory systems: Secondary | ICD-10-CM

## 2016-02-23 DIAGNOSIS — F17200 Nicotine dependence, unspecified, uncomplicated: Secondary | ICD-10-CM | POA: Diagnosis not present

## 2016-02-23 DIAGNOSIS — Z0000 Encounter for general adult medical examination without abnormal findings: Secondary | ICD-10-CM

## 2016-02-23 DIAGNOSIS — Z7185 Encounter for immunization safety counseling: Secondary | ICD-10-CM

## 2016-02-23 DIAGNOSIS — Z8639 Personal history of other endocrine, nutritional and metabolic disease: Secondary | ICD-10-CM | POA: Diagnosis not present

## 2016-02-23 DIAGNOSIS — Z1211 Encounter for screening for malignant neoplasm of colon: Secondary | ICD-10-CM | POA: Diagnosis not present

## 2016-02-23 DIAGNOSIS — Z7189 Other specified counseling: Secondary | ICD-10-CM

## 2016-02-23 DIAGNOSIS — Z780 Asymptomatic menopausal state: Secondary | ICD-10-CM

## 2016-02-23 DIAGNOSIS — Z8249 Family history of ischemic heart disease and other diseases of the circulatory system: Secondary | ICD-10-CM

## 2016-02-23 DIAGNOSIS — Z1231 Encounter for screening mammogram for malignant neoplasm of breast: Secondary | ICD-10-CM

## 2016-02-23 DIAGNOSIS — R252 Cramp and spasm: Secondary | ICD-10-CM

## 2016-02-23 DIAGNOSIS — M810 Age-related osteoporosis without current pathological fracture: Secondary | ICD-10-CM

## 2016-02-23 DIAGNOSIS — Z129 Encounter for screening for malignant neoplasm, site unspecified: Secondary | ICD-10-CM

## 2016-02-23 DIAGNOSIS — K635 Polyp of colon: Secondary | ICD-10-CM

## 2016-02-23 LAB — COMPREHENSIVE METABOLIC PANEL
ALT: 8 U/L (ref 6–29)
AST: 12 U/L (ref 10–35)
Albumin: 4.2 g/dL (ref 3.6–5.1)
Alkaline Phosphatase: 43 U/L (ref 33–130)
BUN: 14 mg/dL (ref 7–25)
CO2: 22 mmol/L (ref 20–31)
Calcium: 9.4 mg/dL (ref 8.6–10.4)
Chloride: 106 mmol/L (ref 98–110)
Creat: 0.68 mg/dL (ref 0.50–0.99)
Glucose, Bld: 108 mg/dL — ABNORMAL HIGH (ref 65–99)
Potassium: 4.4 mmol/L (ref 3.5–5.3)
Sodium: 139 mmol/L (ref 135–146)
Total Bilirubin: 0.5 mg/dL (ref 0.2–1.2)
Total Protein: 6.5 g/dL (ref 6.1–8.1)

## 2016-02-23 LAB — CBC
HCT: 41 % (ref 35.0–45.0)
Hemoglobin: 13.6 g/dL (ref 11.7–15.5)
MCH: 31 pg (ref 27.0–33.0)
MCHC: 33.2 g/dL (ref 32.0–36.0)
MCV: 93.4 fL (ref 80.0–100.0)
MPV: 9.5 fL (ref 7.5–12.5)
Platelets: 319 10*3/uL (ref 140–400)
RBC: 4.39 MIL/uL (ref 3.80–5.10)
RDW: 13.2 % (ref 11.0–15.0)
WBC: 5.7 10*3/uL (ref 4.0–10.5)

## 2016-02-23 LAB — POCT URINALYSIS DIPSTICK
Bilirubin, UA: NEGATIVE
GLUCOSE UA: NEGATIVE
KETONES UA: NEGATIVE
Leukocytes, UA: NEGATIVE
Nitrite, UA: NEGATIVE
Protein, UA: NEGATIVE
UROBILINOGEN UA: NEGATIVE
pH, UA: 6

## 2016-02-23 LAB — T4, FREE: Free T4: 2.1 ng/dL — ABNORMAL HIGH (ref 0.8–1.8)

## 2016-02-23 LAB — TSH: TSH: 0.01 mIU/L — ABNORMAL LOW

## 2016-02-23 NOTE — Patient Instructions (Addendum)
Encounter Diagnoses  Name Primary?  . Annual physical exam Yes  . Encounter for health maintenance examination in adult   . Tobacco use disorder   . Vaccine counseling   . History of Graves' disease   . Family history of premature coronary heart disease   . Osteoporosis, unspecified osteoporosis type, unspecified pathological fracture presence   . Other specified hypothyroidism   . Polyp of colon, unspecified part of colon, unspecified type   . Screening for breast cancer   . Special screening for malignant neoplasms, colon   . Screening for cancer   . Post-menopausal   . Abdominal bruit   . Leg cramping   . Microscopic hematuria    Recommendations:  See your dentist, eye doctor and gynecologist yearly  Plan to scheduled both your mammogram and Bone Density scan soon.   You should be able to do these at the same visit  We will refer you back for updated colonoscopy  Check insurance coverage for Shingles Vaccine, Chest CT lung cancer screen and abdominal ultrasound for aorta screening  I would like to potentially get a screening of your abdomen for atherosclerosis and blood flow screen given sounds I hear on exam called a bruit and your history of smoking.    We will call with lab results  Get me a copy of the biometric screen and cholesterol labs at work  Get your flu shot at work  Continue your current medications  Get routine exercise such as waling and weight bearing exercise at least twice weekly  I will review the EKG and compare to the last one in the chart  STOP SMOKING!!!!!!!!!!!!!!!!!!!!!!!!!!!!!!!!!!!!!!!!!!!

## 2016-02-23 NOTE — Progress Notes (Signed)
Subjective:   HPI  Jodi Flynn is a 60 y.o. female who presents for a complete physical.  Accompanied by her new boyfriend  Concerns: No particular issues.  Reviewed their medical, surgical, family, social, medication, and allergy history and updated chart as appropriate.  Past Medical History:  Diagnosis Date  . Allergy   . Family history of premature coronary heart disease   . Grave's disease   . History of cervical cancer    s/p hysterectomy  . History of renal stone   . Hx of colonic polyps 2007  . Osteoporosis 01/2009   Bone Density 02/2014 no change in density, thus changed from Fosamax to Boniva  . Tobacco use disorder   . Wears glasses     Past Surgical History:  Procedure Laterality Date  . ABDOMINAL HYSTERECTOMY     ovaries intact; age 38yo  . COLONOSCOPY  age 25, 12/12/05   Dr. Verl Blalock  . COLPOSCOPY     age 72  . VEIN SURGERY  1/12   laser ablation left great saphenous vein , stab phlebectomy of multiple varicosities, Dr. Donnetta Hutching     Social History   Social History  . Marital status: Widowed    Spouse name: N/A  . Number of children: N/A  . Years of education: N/A   Occupational History  . 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 Smoking status: Current Some Day Smoker    Packs/day: 0.50    Years: 40.00  . Smokeless tobacco: Never Used  . Alcohol use 4.2 oz/week    2 Standard drinks or equivalent, 5 Glasses of wine per week  . Drug use: No  . Sexual activity: Yes   Other Topics Concern  . Not on file   Social History Narrative   Widowed in 2010. 2 children, walks for exercise, hobbies: puzzles.  Boyfriend of 2 years committed suicide (in front of her) 05/2012    Family History  Problem Relation Age of Onset  . COPD Mother     respiratory failure  . Kidney disease Mother   . Hypertension Mother   . Hyperlipidemia Mother   . Heart disease Father     died of MI, late 35s  . Hypertension  Father   . Hyperlipidemia Father   . Stroke Father   . Hypertension Brother   . Hyperlipidemia Brother   . Kidney disease Brother     kidney stones  . Diabetes Brother     borderline  . Hyperlipidemia Brother   . Hypertension Brother   . Cancer Neg Hx      Current Outpatient Prescriptions:  .  calcium-vitamin D (OSCAL WITH D) 500-200 MG-UNIT per tablet, Take 1 tablet by mouth daily. , Disp: , Rfl:  .  Cyanocobalamin (VITAMIN B 12 PO), Take by mouth., Disp: , Rfl:  .  fish oil-omega-3 fatty acids 1000 MG capsule, Take 2 g by mouth daily. , Disp: , Rfl:  .  ibandronate (BONIVA) 150 MG tablet, Take 1 Q30 days in the am with a full glass of water on empty stomach, & do not take anything else by mouth or lie down for the next 30 min., Disp: 3 tablet, Rfl: 0 .  levothyroxine (SYNTHROID, LEVOTHROID) 150 MCG tablet, TAKE ONE TABLET BY MOUTH DAILY, Disp: 90 tablet, Rfl: 1 .  Multiple Vitamin (MULTIVITAMIN WITH MINERALS) TABS, Take 1 tablet by mouth daily., Disp: , Rfl:  .  HYDROcodone-acetaminophen (NORCO) 5-325  MG tablet, Take 1 tablet by mouth every 6 (six) hours as needed. (Patient not taking: Reported on 02/23/2016), Disp: 30 tablet, Rfl: 0  No Known Allergies   Review of Systems Constitutional: -fever, -chills, -sweats, -unexpected weight change, -decreased appetite, -fatigue Allergy: -sneezing, -itching, -congestion Dermatology: -changing moles, --rash, -lumps ENT: -runny nose, -ear pain, -sore throat, -hoarseness, -sinus pain, -teeth pain, - ringing in ears, -hearing loss, -nosebleeds Cardiology: -chest pain, -palpitations, -swelling, -difficulty breathing when lying flat, -waking up short of breath Respiratory: -cough, -shortness of breath, -difficulty breathing with exercise or exertion, -wheezing, -coughing up blood Gastroenterology: -abdominal pain, -nausea, -vomiting, -diarrhea, -constipation, -blood in stool, -changes in bowel movement, -difficulty swallowing or  eating Hematology: -bleeding, -bruising  Musculoskeletal: -joint aches, -muscle aches, -joint swelling, -back pain, -neck pain, +cramping, -changes in gait Ophthalmology: denies vision changes, eye redness, itching, discharge Urology: -burning with urination, -difficulty urinating, -blood in urine, -urinary frequency, -urgency, -incontinence Neurology: -headache, -weakness, -tingling, -numbness, -memory loss, -falls, -dizziness Psychology: -depressed mood, -agitation, -sleep problems     Objective:   Physical Exam  BP 122/70   Pulse 80   Resp 18   Ht 5' 4.25" (1.632 m)   Wt 124 lb 3.2 oz (56.3 kg)   BMI 21.15 kg/m   General appearance: alert, no distress, WD/WN, lean white female  Skin: right preauricular region with 2 separate raised flesh colored 34mm papules, left ear tragus with vertical fleshy appendage skin that she notes has been unchanged for years, solar keratosis of bilat forearms and hands  HEENT: normocephalic, conjunctiva/corneas normal, sclerae anicteric, PERRLA, EOMi, nares patent, no discharge or erythema, pharynx normal  Oral cavity: MMM, tongue normal, teeth in good repair  Neck: supple, no lymphadenopathy, no thyromegaly, no masses, normal ROM, no bruits  Chest: non tender, normal shape and expansion  Heart: RRR, normal S1, S2, no murmurs  Lungs: CTA bilaterally, no wheezes, rhonchi, or rales  Abdomen: +bs, soft, non tender, non distended, no masses, no hepatomegaly, no splenomegaly, faint bruit over mid aorta of abdomen Back: non tender, normal ROM, no scoliosis  Musculoskeletal: mild bony arthritic changes of bilat 5th finger PIP, right 4th toe MTP with bony arthritis change, otherwise upper extremities non tender, no obvious deformity, normal ROM throughout, lower extremities non tender, no obvious deformity, normal ROM throughout  Extremities: no edema, no cyanosis, no clubbing  Pulses: 1+ symmetric, upper  extremities, normal cap refill , pedal pulses somewhat  decreased 1+ bilat, but less palpable right foot, cap refill normal Neurological: alert, oriented x 3, CN2-12 intact, strength normal upper extremities and lower extremities, sensation normal throughout, DTRs 2+ throughout, no cerebellar signs, gait normal  Psychiatric: normal affect, behavior normal, pleasant  Breast/gyn/rectal - deferred to gyn   Adult ECG Report  Indication: smoker, premature family hx/o CAD, physical  Rate: 60 bpm  Rhythm: normal sinus rhythm  QRS Axis: -41 degrees  PR Interval: 147ms  QRS Duration: 155ms  QTc: 456ms  Conduction Disturbances: left axis deviation and RBBB  Other Abnormalities: none  Patient's cardiac risk factors are: family history of premature cardiovascular disease and smoking/ tobacco exposure.  EKG comparison: 2014  Narrative Interpretation: new changes in II, precordial leads, new LAD and RBBB    Assessment and Plan :    Encounter Diagnoses  Name Primary?  . Annual physical exam Yes  . Encounter for health maintenance examination in adult   . Tobacco use disorder   . Vaccine counseling   . History of Graves' disease   .  Family history of premature coronary heart disease   . Osteoporosis, unspecified osteoporosis type, unspecified pathological fracture presence   . Other specified hypothyroidism   . Polyp of colon, unspecified part of colon, unspecified type   . Screening for breast cancer   . Special screening for malignant neoplasms, colon   . Screening for cancer   . Post-menopausal     Physical exam - discussed healthy lifestyle, diet, exercise, preventative care, vaccinations, and addressed their concerns.  Handout given. Osteoporosis - compliant with Boniva started this past year after not seeing much improvement on Fosamax, reviewed 2015 bone density exam.  referral for update bone density scan Get mammogram soon She will get flu shot at work Hypothyroidism, hx/o graves - c/t current medication, labs today EKG reviewed  given her risk factors of smoker, family hx/o premature CAD Smoker - reviewed PFT today, abnormal, mild COPD. Discussed the significance of this.   Recommended cessation She will check insurance coverage for shingles vaccine and CT lung cancer screen Referral back to GI for updated colonoscopy, Dr. Sharlett Iles F/u with eye doctor, dentist, gyn yearly Pending labs and EKG as above, may send for cardiology consult Follow-up pending studies   Mialynn was seen today for annual exam.  Diagnoses and all orders for this visit:  Annual physical exam -     Urinalysis Dipstick -     CBC -     Comprehensive metabolic panel -     TSH -     VITAMIN D 25 Hydroxy (Vit-D Deficiency, Fractures) -     T4, free  Encounter for health maintenance examination in adult -     EKG 12-Lead  Tobacco use disorder -     EKG 12-Lead -     CT CHEST LUNG CA SCREEN LOW DOSE W/O CM; Future -     Cancel: Spirometry with graph; Future -     Cancel: Spirometry with graph; Future -     Spirometry with graph  Vaccine counseling  History of Graves' disease -     TSH -     T4, free  Family history of premature coronary heart disease -     EKG 12-Lead  Osteoporosis, unspecified osteoporosis type, unspecified pathological fracture presence -     DG Bone Density; Future  Other specified hypothyroidism -     TSH -     T4, free  Polyp of colon, unspecified part of colon, unspecified type -     Ambulatory referral to Gastroenterology  Screening for breast cancer  Special screening for malignant neoplasms, colon -     Ambulatory referral to Gastroenterology  Screening for cancer -     CT CHEST LUNG CA SCREEN LOW DOSE W/O CM; Future  Post-menopausal  Abdominal bruit  Leg cramping  Microscopic hematuria -     Urine Microscopic

## 2016-02-24 ENCOUNTER — Other Ambulatory Visit: Payer: Self-pay | Admitting: Medical

## 2016-02-24 LAB — URINALYSIS, MICROSCOPIC ONLY
Bacteria, UA: NONE SEEN [HPF]
CASTS: NONE SEEN [LPF]
CRYSTALS: NONE SEEN [HPF]
YEAST: NONE SEEN [HPF]

## 2016-02-24 LAB — VITAMIN D 25 HYDROXY (VIT D DEFICIENCY, FRACTURES): VIT D 25 HYDROXY: 33 ng/mL (ref 30–100)

## 2016-02-24 MED ORDER — LEVOTHYROXINE SODIUM 137 MCG PO TABS: 137.0000 ug | ORAL_TABLET | Freq: Every day | ORAL | 1 refills | Status: DC

## 2016-02-24 MED ORDER — IBANDRONATE SODIUM 150 MG PO TABS: ORAL_TABLET | ORAL | 3 refills | Status: DC

## 2016-04-10 ENCOUNTER — Ambulatory Visit (INDEPENDENT_AMBULATORY_CARE_PROVIDER_SITE_OTHER): Payer: Managed Care, Other (non HMO) | Admitting: Medical

## 2016-04-10 ENCOUNTER — Encounter: Payer: Self-pay | Admitting: Medical

## 2016-04-10 ENCOUNTER — Telehealth: Payer: Self-pay | Admitting: Medical

## 2016-04-10 VITALS — BP 116/72 | HR 63 | Wt 124.4 lb

## 2016-04-10 DIAGNOSIS — R1900 Intra-abdominal and pelvic swelling, mass and lump, unspecified site: Secondary | ICD-10-CM | POA: Diagnosis not present

## 2016-04-10 DIAGNOSIS — J449 Chronic obstructive pulmonary disease, unspecified: Secondary | ICD-10-CM

## 2016-04-10 DIAGNOSIS — Z7185 Encounter for immunization safety counseling: Secondary | ICD-10-CM

## 2016-04-10 DIAGNOSIS — E038 Other specified hypothyroidism: Secondary | ICD-10-CM

## 2016-04-10 DIAGNOSIS — F17200 Nicotine dependence, unspecified, uncomplicated: Secondary | ICD-10-CM | POA: Diagnosis not present

## 2016-04-10 DIAGNOSIS — Z122 Encounter for screening for malignant neoplasm of respiratory organs: Secondary | ICD-10-CM | POA: Diagnosis not present

## 2016-04-10 DIAGNOSIS — Z8249 Family history of ischemic heart disease and other diseases of the circulatory system: Secondary | ICD-10-CM | POA: Diagnosis not present

## 2016-04-10 DIAGNOSIS — R0989 Other specified symptoms and signs involving the circulatory and respiratory systems: Secondary | ICD-10-CM | POA: Diagnosis not present

## 2016-04-10 DIAGNOSIS — Z7189 Other specified counseling: Secondary | ICD-10-CM | POA: Diagnosis not present

## 2016-04-10 DIAGNOSIS — Z129 Encounter for screening for malignant neoplasm, site unspecified: Secondary | ICD-10-CM | POA: Diagnosis not present

## 2016-04-10 DIAGNOSIS — R9431 Abnormal electrocardiogram [ECG] [EKG]: Secondary | ICD-10-CM | POA: Diagnosis not present

## 2016-04-10 LAB — T4, FREE: Free T4: 1.9 ng/dL — ABNORMAL HIGH (ref 0.8–1.8)

## 2016-04-10 LAB — TSH: TSH: 0.02 mIU/L — ABNORMAL LOW

## 2016-04-10 NOTE — Telephone Encounter (Signed)
Sent referral to cardio, set up ct scan  Gave patient date and time, she wasn't able to go that day. She is going [place] [facility]. To get r/s at another time. Gave her the # to [employer].

## 2016-04-10 NOTE — Telephone Encounter (Signed)
Set up chest CT lung cancer screening.  This will require precertification per her insurance  Refer to [facility] heart care for evaluation of abnormal EKG, risk factors for heart disease, abdominal bruit

## 2016-04-10 NOTE — Progress Notes (Signed)
Subjective: Chief Complaint  Patient presents with  . follow up from thyroid    follow up from thyroid   Here to discuss issues from last visit, recheck on thyroid  Since last visit she got her appt scheduled for colonoscopy for 05/11/16.  She called and checked insurance coverage for shingles vaccine and chest CT. The CT requires pre certification, shingles vaccine is covered.  AAA screen is potentially covered depending upon CPT code  She is here to discussed abnormal EKG from last visit, PFT from last visit.  She denies SOB, cough, chest pain, fatigue, recurrent bronchitis, and no claudication  Past Medical History:  Diagnosis Date  . Allergy   . Family history of premature coronary heart disease   . Grave's disease   . History of cervical cancer    s/p hysterectomy  . History of renal stone   . Hx of colonic polyps 2007  . Osteoporosis 01/2009   Bone Density 02/2014 no change in density, thus changed from Fosamax to Boniva  . Tobacco use disorder   . Wears glasses     Past Surgical History:  Procedure Laterality Date  . ABDOMINAL HYSTERECTOMY     ovaries intact; age 30yo  . COLONOSCOPY  age 69, 12/12/05   Dr. Verl Blalock  . COLPOSCOPY     age 42  . VEIN SURGERY  1/12   laser ablation left great saphenous vein , stab phlebectomy of multiple varicosities, Dr. Donnetta Hutching     Social History   Social History  . Marital status: Widowed    Spouse name: N/A  . Number of children: N/A  . Years of education: N/A   Occupational History  . 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 Smoking status: Current Some Day Smoker    Packs/day: 0.50    Years: 40.00    Types: Cigarettes  . Smokeless tobacco: Never Used  . Alcohol use 4.2 oz/week    5 Glasses of wine, 2 Standard drinks or equivalent per week  . Drug use: No  . Sexual activity: Yes   Other Topics Concern  . Not on file   Social History Narrative   Widowed in 2010. 2  children, walks for exercise, hobbies: puzzles.  Boyfriend of 2 years committed suicide (in front of her) 05/2012    Family History  Problem Relation Age of Onset  . COPD Mother     respiratory failure  . Kidney disease Mother   . Hypertension Mother   . Hyperlipidemia Mother   . Heart disease Father     died of MI, late 10s  . Hypertension Father   . Hyperlipidemia Father   . Stroke Father   . Hypertension Brother   . Hyperlipidemia Brother   . Kidney disease Brother     kidney stones  . Diabetes Brother     borderline  . Hyperlipidemia Brother   . Hypertension Brother   . Cancer Neg Hx      Current Outpatient Prescriptions:  .  calcium-vitamin D (OSCAL WITH D) 500-200 MG-UNIT per tablet, Take 1 tablet by mouth daily. , Disp: , Rfl:  .  Cyanocobalamin (VITAMIN B 12 PO), Take by mouth., Disp: , Rfl:  .  fish oil-omega-3 fatty acids 1000 MG capsule, Take 2 g by mouth daily. , Disp: , Rfl:  .  ibandronate (BONIVA) 150 MG tablet, Take 1 Q30 days in the am with a full glass of water on empty  stomach, & do not take anything else by mouth or lie down for the next 30 min., Disp: 3 tablet, Rfl: 3 .  levothyroxine (SYNTHROID) 137 MCG tablet, Take 1 tablet (137 mcg total) by mouth daily before breakfast., Disp: 90 tablet, Rfl: 1 .  Multiple Vitamin (MULTIVITAMIN WITH MINERALS) TABS, Take 1 tablet by mouth daily., Disp: , Rfl:   No Known Allergies  ROS as in subjective   Objective: BP 116/72   Pulse 63   Wt 124 lb 6.4 oz (56.4 kg)   SpO2 97%   BMI 21.19 kg/m   General appearance: alert, no distress, WD/WN, lean white female  HEENT: normocephalic, conjunctiva/corneas normal, sclerae anicteric, PERRLA, EOMi, nares patent, no discharge or erythema, pharynx normal  Oral cavity: MMM, tongue normal, teeth in good repair  Neck: supple, no lymphadenopathy, no thyromegaly, no masses, normal ROM, no bruits  Chest: non tender, normal shape and expansion  Heart: RRR, normal S1, S2, no  murmurs  Lungs: mildly decreased breath sounds in general, otherwise no wheezes, rhonchi, or rales  Abdomen: +bs, soft, non tender, non distended, no masses, no hepatomegaly, no splenomegaly, faint bruit over mid aorta of abdomen Extremities: no edema, no cyanosis, no clubbing  Pulses: 1+ symmetric, upper  extremities, normal cap refill , pedal pulses somewhat decreased 1+ bilat, but less palpable right foot, cap refill normal   Assessment: Encounter Diagnoses  Name Primary?  . Nonspecific abnormal electrocardiogram (ECG) (EKG) Yes  . Family history of premature coronary heart disease   . Abdominal bruit   . Tobacco use disorder   . COPD suggested by initial evaluation (Jodi Flynn)   . Other specified hypothyroidism   . Vaccine counseling   . Encounter for screening for lung cancer   . Pulsatile abdominal mass   . Screening for cancer     Plan: Abnormal EKG with new changes on 02/2016 EKG compared to 2014 EKG, family hx/o premature CAD, smoker - referral to cardiology for evaluation  Abdominal bruit - refer to cardiology, and consider AAA screening  Strongly advised smoking cessation given risks of tobacco as discussed.  She will consider.  Reviewed the PFTs from last visit showing mild obstruction  Hypothyroidism - we adjusted down down last visit.  Repeat labs today.  She notes no additional nausea or diarrhea since the dose changes  She will return in spring 2018 when the new shingles vaccine is available  Referral for chest CT lung cancer screen  Jodi Flynn was seen today for follow up from thyroid.  Diagnoses and all orders for this visit:  Nonspecific abnormal electrocardiogram (ECG) (EKG) -     Ambulatory referral to Cardiology  Family history of premature coronary heart disease -     Ambulatory referral to Cardiology  Abdominal bruit -     Ambulatory referral to Cardiology  Tobacco use disorder -     Ambulatory referral to Cardiology -     CT CHEST LUNG CA SCREEN LOW  DOSE W/O CM; Future  COPD suggested by initial evaluation ([place]) -     CT CHEST LUNG CA SCREEN LOW DOSE W/O CM; Future  Other specified hypothyroidism -     TSH -     T4, free  Vaccine counseling  Encounter for screening for lung cancer  Pulsatile abdominal mass  Screening for cancer

## 2016-04-11 ENCOUNTER — Other Ambulatory Visit: Payer: Self-pay | Admitting: Medical

## 2016-04-11 DIAGNOSIS — E039 Hypothyroidism, unspecified: Secondary | ICD-10-CM

## 2016-04-11 MED ORDER — LEVOTHYROXINE SODIUM 125 MCG PO TABS: 125.0000 ug | ORAL_TABLET | Freq: Every day | ORAL | 2 refills | Status: DC

## 2016-04-16 ENCOUNTER — Ambulatory Visit: Payer: Commercial Managed Care - PPO

## 2016-04-27 ENCOUNTER — Telehealth: Payer: Self-pay | Admitting: *Deleted

## 2016-04-27 ENCOUNTER — Telehealth: Payer: Self-pay | Admitting: Internal Medicine

## 2016-04-27 NOTE — Telephone Encounter (Signed)
Ok, thanks for the heads up.    C/t plan to see cardiology

## 2016-04-27 NOTE — Telephone Encounter (Signed)
Pt called and states that she saw GI yesterday and that they will not do her colonoscopy until she sees Cardiology later on in January due to her abnormal EKG she had done here. Pt wanted you to know

## 2016-04-27 NOTE — Telephone Encounter (Signed)
Pt presented today for PV appointment. Pt seen by Dr. Glade Lloyd 04/10/16 and had abnormal EKG. Has been referred to cardiology at the end of January. Needs cardiac clearance prior to screening colonoscopy per Lucretia Kern, CRNA.

## 2016-05-03 ENCOUNTER — Other Ambulatory Visit: Payer: Self-pay | Admitting: Medical

## 2016-05-07 ENCOUNTER — Inpatient Hospital Stay: Admission: RE | Admit: 2016-05-07 | Payer: Commercial Managed Care - PPO | Source: Ambulatory Visit

## 2016-05-11 ENCOUNTER — Encounter: Payer: Commercial Managed Care - PPO | Admitting: Gastroenterology

## 2016-05-24 ENCOUNTER — Encounter: Payer: Self-pay | Admitting: Interventional Cardiology

## 2016-05-24 ENCOUNTER — Ambulatory Visit (INDEPENDENT_AMBULATORY_CARE_PROVIDER_SITE_OTHER): Payer: Managed Care, Other (non HMO) | Admitting: Interventional Cardiology

## 2016-05-24 VITALS — BP 150/86 | HR 69 | Ht 64.0 in | Wt 124.4 lb

## 2016-05-24 DIAGNOSIS — R1900 Intra-abdominal and pelvic swelling, mass and lump, unspecified site: Secondary | ICD-10-CM | POA: Diagnosis not present

## 2016-05-24 DIAGNOSIS — I4510 Unspecified right bundle-branch block: Secondary | ICD-10-CM | POA: Insufficient documentation

## 2016-05-24 DIAGNOSIS — F17200 Nicotine dependence, unspecified, uncomplicated: Secondary | ICD-10-CM | POA: Diagnosis not present

## 2016-05-24 DIAGNOSIS — Z8249 Family history of ischemic heart disease and other diseases of the circulatory system: Secondary | ICD-10-CM

## 2016-05-24 NOTE — Patient Instructions (Signed)
Medication Instructions:  Same-no changes  Labwork: None  Testing/Procedures: None  Follow-Up: As needed     If you need a refill on your cardiac medications before your next appointment, please call your pharmacy.   

## 2016-05-24 NOTE — Progress Notes (Signed)
Cardiology Office Note   Date:  05/24/2016   ID:  Jodi Flynn, DOB 05-27-1955, MRN [id number]  PCP:  Crisoforo Oxford, PA-C    Chief Complaint  Patient presents with  . New Patient (Initial Visit)  abnormal ECG   Wt Readings from Last 3 Encounters:  05/24/16 124 lb 6.4 oz (56.4 kg)  04/10/16 124 lb 6.4 oz (56.4 kg)  02/23/16 124 lb 3.2 oz (56.3 kg)       History of Present Illness: Jodi Flynn is a 61 y.o. female   With a family h/o CAD in her parents.  She was found to have an abnormality on her ECG and was referred.    She denies any chest discomfort. She walks a lot at work. She watches her grandkids and plays with them. She swims in warmer weather. She has no chest discomfort or shortness of breath. No lightheadedness or dizziness. Overall, she feels well.  She quit smoking on 04/30/2016 after smoking for more than 30 years. She is doing well with this although she does admit to cheating on a few occasions. Overall, she tries to lead a healthy lifestyle. She lost a lot of weight several years ago. She maintains a healthy diet. She avoids eating out. She avoids fast food.    Past Medical History:  Diagnosis Date  . Allergy   . Family history of premature coronary heart disease   . Grave's disease   . History of cervical cancer    s/p hysterectomy  . History of renal stone   . Hx of colonic polyps 2007  . Osteoporosis 01/2009   Bone Density 02/2014 no change in density, thus changed from Fosamax to Boniva  . Tobacco use disorder   . Wears glasses     Past Surgical History:  Procedure Laterality Date  . ABDOMINAL HYSTERECTOMY     ovaries intact; age 25yo  . COLONOSCOPY  age 89, 12/12/05   Dr. Verl Blalock  . COLPOSCOPY     age 41  . VEIN SURGERY  1/12   laser ablation left great saphenous vein , stab phlebectomy of multiple varicosities, Dr. Donnetta Hutching      Current Outpatient Prescriptions  Medication Sig Dispense Refill  . calcium-vitamin  D (OSCAL WITH D) 500-200 MG-UNIT per tablet Take 1 tablet by mouth daily.     . Cyanocobalamin (VITAMIN B 12 PO) Take by mouth.    . fish oil-omega-3 fatty acids 1000 MG capsule Take 2 g by mouth daily.     Marland Kitchen ibandronate (BONIVA) 150 MG tablet Take 1 Q30 days in the am with a full glass of water on empty stomach, & do not take anything else by mouth or lie down for the next 30 min. 3 tablet 3  . levothyroxine (SYNTHROID) 125 MCG tablet Take 1 tablet (125 mcg total) by mouth daily before breakfast. 30 tablet 2  . Multiple Vitamin (MULTIVITAMIN WITH MINERALS) TABS Take 1 tablet by mouth daily.     No current facility-administered medications for this visit.     Allergies:   Patient has no known allergies.    Social History:  The patient  reports that she has been smoking Cigarettes.  She has a 20.00 pack-year smoking history. She has never used smokeless tobacco. She reports that she drinks about 4.2 oz of alcohol per week . She reports that she does not use drugs.   Family History:  The patient's family history includes COPD in her mother;  Diabetes in her brother; Heart disease in her father; Hyperlipidemia in her brother, brother, father, and mother; Hypertension in her brother, brother, father, and mother; Kidney disease in her brother and mother; Stroke in her father.    ROS:  Please see the history of present illness.   Otherwise, review of systems are positive for trying to stop smoking.   All other systems are reviewed and negative.    PHYSICAL EXAM: VS:  BP (!) 150/86   Pulse 69   Ht 5\' 4"  (1.626 m)   Wt 124 lb 6.4 oz (56.4 kg)   SpO2 92%   BMI 21.35 kg/m  , BMI Body mass index is 21.35 kg/m. GEN: Well nourished, well developed, in no acute distress  HEENT: normal  Neck: no JVD, carotid bruits, or masses Cardiac: RRR; no murmurs, rubs, or gallops,no edema , 2+ radial pulses bilaterally Respiratory:  clear to auscultation bilaterally, normal work of breathing GI: soft,  nontender, nondistended, + BS, easily  MS: no deformity or atrophy  Skin: warm and dry, no rash Neuro:  Strength and sensation are intact Psych: euthymic mood, full affect   EKG:   The ekg ordered demonstrates NSR, RBBB; RBBB new from 2014   Recent Labs: 02/23/2016: ALT 8; BUN 14; Creat 0.68; Hemoglobin 13.6; Platelets 319; Potassium 4.4; Sodium 139 04/10/2016: TSH 0.02   Lipid Panel    Component Value Date/Time   CHOL 175 04/08/2014 0812   TRIG 67 04/08/2014 0812   HDL 71 04/08/2014 0812   CHOLHDL 2.5 04/08/2014 0812   VLDL 13 04/08/2014 0812   LDLCALC 91 04/08/2014 0812     Other studies Reviewed: Additional studies/ records that were reviewed today with results demonstrating: 2014 ECG reviewed as well.   ASSESSMENT AND PLAN:  1. RBBB: New finding on ECG. Not related to ischemia. No symptoms of bradycardia or conduction disease. 2. Premature coronary artery disease in her family: Continue aggressive preventative treatment. Most importantly, she is trying to stop smoking. Continue to eat a healthy diet. Encouraged regular exercise. She is very conscious of avoiding weight gain.  3. Tobacco abuse: I encouraged her strongly to continue to try to quit. This is best thing she can do for her health. 4. Palpable aortic pulse in the abdomen. Primary care is looking into getting her an ultrasound to evaluate for aneurysm. 5. Repeat blood pressure in the office 140/82. Continue to check outside of the doctor's office. 6. At this point, I don't think she needs any further testing. If she develops any symptoms such as chest discomfort, shortness of breath or lightheadedness, would plan for treadmill testing. At this point, I think risk factor modification is the most important thing for her.  She will contact us if she has any further symptoms.   Current medicines are reviewed at length with the patient today.  The patient concerns regarding her medicines were addressed.  The  following changes have been made:  No change  Labs/ tests ordered today include:  No orders of the defined types were placed in this encounter.   Recommend 150 minutes/week of aerobic exercise Low fat, low carb, high fiber diet recommended  Disposition:   FU in prn   Signed, Larae Grooms, MD  05/24/2016 9:22 AM    [facility] [address], [place], [place]  [address] Phone: [phone]; Fax: [phone]

## 2016-05-25 ENCOUNTER — Other Ambulatory Visit: Payer: Self-pay | Admitting: Medical

## 2016-05-25 ENCOUNTER — Telehealth: Payer: Self-pay | Admitting: Medical

## 2016-05-25 DIAGNOSIS — R198 Other specified symptoms and signs involving the digestive system and abdomen: Secondary | ICD-10-CM

## 2016-05-25 NOTE — Telephone Encounter (Signed)
I reviewed her cardiology notes.  Refer for abdominal US to evaluate for aneurysm.

## 2016-05-25 NOTE — Telephone Encounter (Signed)
Sent order to [facility] they will call the patient

## 2016-06-05 ENCOUNTER — Other Ambulatory Visit: Payer: Self-pay | Admitting: Medical

## 2016-06-05 ENCOUNTER — Ambulatory Visit
Admission: RE | Admit: 2016-06-05 | Discharge: 2016-06-05 | Disposition: A | Discharge status: Home / Self Care | Payer: Managed Care, Other (non HMO) | Source: Ambulatory Visit | Attending: Medical | Admitting: Medical

## 2016-06-05 DIAGNOSIS — R198 Other specified symptoms and signs involving the digestive system and abdomen: Secondary | ICD-10-CM

## 2016-06-05 MED ORDER — PRAVASTATIN SODIUM 20 MG PO TABS: 20.0000 mg | ORAL_TABLET | Freq: Every day | ORAL | 2 refills | Status: DC

## 2016-07-09 ENCOUNTER — Other Ambulatory Visit: Payer: Managed Care, Other (non HMO)

## 2016-07-09 DIAGNOSIS — E039 Hypothyroidism, unspecified: Secondary | ICD-10-CM

## 2016-07-09 LAB — TSH: TSH: 0.05 m[IU]/L — AB

## 2016-07-10 ENCOUNTER — Other Ambulatory Visit: Payer: Self-pay | Admitting: Medical

## 2016-07-10 DIAGNOSIS — E039 Hypothyroidism, unspecified: Secondary | ICD-10-CM

## 2016-07-10 MED ORDER — LEVOTHYROXINE SODIUM 100 MCG PO TABS: 100.0000 ug | ORAL_TABLET | Freq: Every day | ORAL | 0 refills | Status: DC

## 2016-08-09 ENCOUNTER — Ambulatory Visit (INDEPENDENT_AMBULATORY_CARE_PROVIDER_SITE_OTHER): Payer: Managed Care, Other (non HMO) | Admitting: Medical

## 2016-08-09 ENCOUNTER — Encounter: Payer: Self-pay | Admitting: Medical

## 2016-08-09 VITALS — BP 116/82 | HR 81 | Wt 121.4 lb

## 2016-08-09 DIAGNOSIS — Z23 Encounter for immunization: Secondary | ICD-10-CM

## 2016-08-09 DIAGNOSIS — I700 Atherosclerosis of aorta: Secondary | ICD-10-CM | POA: Insufficient documentation

## 2016-08-09 DIAGNOSIS — F17200 Nicotine dependence, unspecified, uncomplicated: Secondary | ICD-10-CM

## 2016-08-09 DIAGNOSIS — Z129 Encounter for screening for malignant neoplasm, site unspecified: Secondary | ICD-10-CM

## 2016-08-09 DIAGNOSIS — I4510 Unspecified right bundle-branch block: Secondary | ICD-10-CM

## 2016-08-09 DIAGNOSIS — E038 Other specified hypothyroidism: Secondary | ICD-10-CM | POA: Diagnosis not present

## 2016-08-09 LAB — LIPID PANEL
CHOL/HDL RATIO: 2.2 ratio (ref <5.0)
Cholesterol: 175 mg/dL (ref <200)
HDL: 81 mg/dL (ref >50)
LDL CALC: 75 mg/dL (ref <100)
TRIGLYCERIDES: 97 mg/dL (ref <150)
VLDL: 19 mg/dL (ref <30)

## 2016-08-09 LAB — ALT: ALT: 9 U/L (ref 6–29)

## 2016-08-09 LAB — T4, FREE: Free T4: 1.3 ng/dL (ref 0.8–1.8)

## 2016-08-09 LAB — TSH: TSH: 0.74 mIU/L

## 2016-08-09 MED ORDER — ASPIRIN EC 81 MG PO TBEC: 81.0000 mg | DELAYED_RELEASE_TABLET | Freq: Every day | ORAL | 3 refills | Status: DC

## 2016-08-09 NOTE — Progress Notes (Signed)
Subjective: Chief Complaint  Patient presents with  . Follow-up    follow up from thyroids   Here for f/u on chronic issues and from last physical concerns.  Hypothyroidism - Taking the Synthroid 146mcg daily in the morning on empty stomach, but didn't take the dose today as she was coming in for labs.    Atherosclerosis noted on abdomina aortic ultrasound - Taking Pravachol medication QHS.  Has cut down on smoking, trying to quit.   She has not yet had repeat colonoscopy, mammogram, or bone density scan.    Working on getting some new tattoos.   Past Medical History:  Diagnosis Date  . Allergy   . Family history of premature coronary heart disease   . Grave's disease   . History of cervical cancer    s/p hysterectomy  . History of renal stone   . Hx of colonic polyps 2007  . Osteoporosis 01/2009   Bone Density 02/2014 no change in density, thus changed from Fosamax to Boniva  . Tobacco use disorder   . Wears glasses    Current Outpatient Prescriptions on File Prior to Visit  Medication Sig Dispense Refill  . calcium-vitamin D (OSCAL WITH D) 500-200 MG-UNIT per tablet Take 1 tablet by mouth daily.     . Cyanocobalamin (VITAMIN B 12 PO) Take by mouth.    . fish oil-omega-3 fatty acids 1000 MG capsule Take 2 g by mouth daily.     Marland Kitchen ibandronate (BONIVA) 150 MG tablet Take 1 Q30 days in the am with a full glass of water on empty stomach, & do not take anything else by mouth or lie down for the next 30 min. 3 tablet 3  . levothyroxine (SYNTHROID) 100 MCG tablet Take 1 tablet (100 mcg total) by mouth daily before breakfast. 30 tablet 0  . Multiple Vitamin (MULTIVITAMIN WITH MINERALS) TABS Take 1 tablet by mouth daily.    . pravastatin (PRAVACHOL) 20 MG tablet Take 1 tablet (20 mg total) by mouth daily. 30 tablet 2   No current facility-administered medications on file prior to visit.    Review of Systems Constitutional: -fever, -chills, -sweats, -unexpected weight  change,-fatigue ENT: -runny nose, -ear pain, -sore throat Cardiology:  -chest pain, -palpitations, -edema Respiratory: -cough, -shortness of breath, -wheezing Gastroenterology: -abdominal pain, -nausea, -vomiting, -diarrhea, -constipation Hematology: -bleeding or bruising problems Musculoskeletal: -arthralgias, -myalgias, -joint swelling, -back pain Ophthalmology: -vision changes Urology: -dysuria, -difficulty urinating, -hematuria, -urinary frequency, -urgency Neurology: -headache, -weakness, -tingling, -numbness  Objective: BP 116/82   Pulse 81   Wt 121 lb 6.4 oz (55.1 kg)   SpO2 96%   BMI 20.84 kg/m   General appearance: alert, no distress, WD/WN,  Neck: supple, no lymphadenopathy, no thyromegaly, no masses Heart: RRR, normal S1, S2, no murmurs Lungs: CTA bilaterally, no wheezes, rhonchi, or rales Abdomen: +bs, soft, non tender, non distended, no masses, no hepatomegaly, no splenomegaly Pulses: 2+ symmetric, upper and lower extremities, normal cap refill     Assessment: Encounter Diagnoses  Name Primary?  . Other specified hypothyroidism Yes  . Atherosclerosis of aorta ([place])   . RBBB   . Tobacco use disorder   . Screening for cancer   . Need for shingles vaccine      Plan: hypothyroidism - compliant with synthroid 100 mcg daily, lab today Atherosclerosis -discussed diagnosis, possible complications.   c/t Pravachol, add ASA 81mg  QHS, fasting lipid lab today RBBB - reviewed recent cardiology evaluation tobacco use - c/t efforts to stop tobacco  Go for mammogram, bone density scan Counseled on the Shingrix vaccine.  Vaccine information sheet given.  Shingrix vaccine #1 given after consent obtained.  Return in 48mo for Shingrix #2  Patrick was seen today for follow-up.  Diagnoses and all orders for this visit:  Other specified hypothyroidism -     TSH -     T4, free -     ALT  Atherosclerosis of aorta ([facility]) -     Lipid panel -     ALT  RBBB -      ALT  Tobacco use disorder -     Lipid panel  Screening for cancer  Need for shingles vaccine

## 2016-08-09 NOTE — Patient Instructions (Signed)
Recommendations: Go ahead and schedule your mammogram and bone density scan for the same day Follow up with gastro about your repeat colonoscopy We will call with lab results We updated the first dose of the Shingrix vaccine today

## 2016-08-10 ENCOUNTER — Other Ambulatory Visit: Payer: Self-pay | Admitting: Medical

## 2016-08-10 ENCOUNTER — Other Ambulatory Visit: Payer: Self-pay

## 2016-08-10 MED ORDER — PRAVASTATIN SODIUM 20 MG PO TABS: 20.0000 mg | ORAL_TABLET | Freq: Every day | ORAL | 3 refills | Status: DC

## 2016-08-10 MED ORDER — LEVOTHYROXINE SODIUM 100 MCG PO TABS
100.0000 ug | ORAL_TABLET | Freq: Every day | ORAL | 3 refills | Status: DC
Start: 2016-08-10 — End: 2016-08-10

## 2016-08-10 MED ORDER — LEVOTHYROXINE SODIUM 100 MCG PO TABS: 100.0000 ug | ORAL_TABLET | Freq: Every day | ORAL | 3 refills | Status: DC

## 2016-08-10 MED ORDER — IBANDRONATE SODIUM 150 MG PO TABS
ORAL_TABLET | ORAL | 3 refills | Status: DC
Start: 2016-08-10 — End: 2017-10-03

## 2016-09-10 ENCOUNTER — Other Ambulatory Visit (INDEPENDENT_AMBULATORY_CARE_PROVIDER_SITE_OTHER): Payer: Managed Care, Other (non HMO)

## 2016-09-10 DIAGNOSIS — Z23 Encounter for immunization: Secondary | ICD-10-CM

## 2016-09-10 DIAGNOSIS — E038 Other specified hypothyroidism: Secondary | ICD-10-CM

## 2016-10-11 ENCOUNTER — Encounter: Payer: Self-pay | Admitting: Medical

## 2016-10-11 ENCOUNTER — Ambulatory Visit (INDEPENDENT_AMBULATORY_CARE_PROVIDER_SITE_OTHER): Payer: Managed Care, Other (non HMO) | Admitting: Medical

## 2016-10-11 VITALS — BP 132/70 | HR 82 | Wt 117.4 lb

## 2016-10-11 DIAGNOSIS — R197 Diarrhea, unspecified: Secondary | ICD-10-CM

## 2016-10-11 DIAGNOSIS — Z8639 Personal history of other endocrine, nutritional and metabolic disease: Secondary | ICD-10-CM

## 2016-10-11 DIAGNOSIS — Z113 Encounter for screening for infections with a predominantly sexual mode of transmission: Secondary | ICD-10-CM

## 2016-10-11 DIAGNOSIS — R5383 Other fatigue: Secondary | ICD-10-CM

## 2016-10-11 DIAGNOSIS — I700 Atherosclerosis of aorta: Secondary | ICD-10-CM | POA: Diagnosis not present

## 2016-10-11 DIAGNOSIS — E039 Hypothyroidism, unspecified: Secondary | ICD-10-CM | POA: Diagnosis not present

## 2016-10-11 DIAGNOSIS — Z1159 Encounter for screening for other viral diseases: Secondary | ICD-10-CM | POA: Diagnosis not present

## 2016-10-11 DIAGNOSIS — R634 Abnormal weight loss: Secondary | ICD-10-CM

## 2016-10-11 LAB — CBC
HCT: 40.6 % (ref 35.0–45.0)
Hemoglobin: 13.3 g/dL (ref 11.7–15.5)
MCH: 30.6 pg (ref 27.0–33.0)
MCHC: 32.8 g/dL (ref 32.0–36.0)
MCV: 93.5 fL (ref 80.0–100.0)
MPV: 9.6 fL (ref 7.5–12.5)
Platelets: 289 10*3/uL (ref 140–400)
RBC: 4.34 MIL/uL (ref 3.80–5.10)
RDW: 13.6 % (ref 11.0–15.0)
WBC: 5.7 10*3/uL (ref 4.0–10.5)

## 2016-10-11 LAB — TSH: TSH: 1.07 m[IU]/L

## 2016-10-11 LAB — T4, FREE: FREE T4: 1.6 ng/dL (ref 0.8–1.8)

## 2016-10-11 NOTE — Progress Notes (Signed)
Subjective: Chief Complaint  Patient presents with  . side effect of  new meds    weight  loss and nausea  since she has been on cholrestrol meds    Here for medication side effects.  Since starting Pravachol in 07/2016, she has noticed worsening symptoms of fatigue, has lost some weight, has intermittent diarrhea that she attributes to Pravachol.   Prior to 07/2016 had never been on statin drug.    She is compliant with thyroid medication every morning on empty stomach, and no recent change in pill manufacture  Wants testing for Hep C.   No blood in stool or urine, no fever, no night sweats, no new skin lesions no swollen glands.    Past Medical History:  Diagnosis Date  . Allergy   . Family history of premature coronary heart disease   . Grave's disease   . History of cervical cancer    s/p hysterectomy  . History of renal stone   . Hx of colonic polyps 2007  . Osteoporosis 01/2009   Bone Density 02/2014 no change in density, thus changed from Fosamax to Boniva  . Tobacco use disorder   . Wears glasses    Current Outpatient Prescriptions on File Prior to Visit  Medication Sig Dispense Refill  . aspirin EC 81 MG tablet Take 1 tablet (81 mg total) by mouth daily. 90 tablet 3  . calcium-vitamin D (OSCAL WITH D) 500-200 MG-UNIT per tablet Take 1 tablet by mouth daily.     . Cyanocobalamin (VITAMIN B 12 PO) Take by mouth.    . fish oil-omega-3 fatty acids 1000 MG capsule Take 2 g by mouth daily.     Marland Kitchen ibandronate (BONIVA) 150 MG tablet Take 1 Q30 days in the am with a full glass of water on empty stomach, & do not take anything else by mouth or lie down for the next 30 min. 3 tablet 3  . levothyroxine (SYNTHROID) 100 MCG tablet Take 1 tablet (100 mcg total) by mouth daily before breakfast. 90 tablet 3  . Multiple Vitamin (MULTIVITAMIN WITH MINERALS) TABS Take 1 tablet by mouth daily.    . pravastatin (PRAVACHOL) 20 MG tablet Take 1 tablet (20 mg total) by mouth daily. (Patient not  taking: Reported on 10/11/2016) 90 tablet 3   No current facility-administered medications on file prior to visit.    ROS as in subjective   Objective: BP 132/70   Pulse 82   Wt 117 lb 6.4 oz (53.3 kg)   SpO2 97%   BMI 20.15 kg/m   Wt Readings from Last 3 Encounters:  10/11/16 117 lb 6.4 oz (53.3 kg)  08/09/16 121 lb 6.4 oz (55.1 kg)  05/24/16 124 lb 6.4 oz (56.4 kg)   General appearance: alert, no distress, WD/WN,  Oral cavity: MMM, no lesions Neck: supple, no lymphadenopathy, no thyromegaly, no masses Heart: RRR, normal S1, S2, no murmurs Lungs: CTA bilaterally, no wheezes, rhonchi, or rales Abdomen: +bs, soft, non tender, non distended, no masses, no hepatomegaly, no splenomegaly Pulses: 2+ symmetric, upper and lower extremities, normal cap refill Ext: no edema   Assessment: Encounter Diagnoses  Name Primary?  . Fatigue, unspecified type Yes  . Need for hepatitis C screening test   . Weight loss   . Diarrhea, unspecified type   . Atherosclerosis of aorta ([place])   . History of Graves' disease   . Hypothyroidism, unspecified type   . Screen for STD (sexually transmitted disease)  Plan Discussed possible causes.  Likely statin related side effects but need to rule out thyroid cause or other.   Stop Pravachol as she has done.   C/t same thyroid dose 122mcg daily in morning as discussed Screening for Hep C, as well as HIV and Hep B at this time. Will request last pap from [facility], Dr. Pamala Hurry Discussed possibly trying a new statin going forward given the atherosclerosis on recent scan  Jodi Flynn was seen today for side effect of  new meds.  Diagnoses and all orders for this visit:  Fatigue, unspecified type -     HIV antibody -     RPR -     Hepatitis C antibody -     Hepatitis B surface antigen -     TSH -     CBC -     T4, free  Need for hepatitis C screening test -     HIV antibody -     RPR -     Hepatitis C antibody -     Hepatitis B  surface antigen  Weight loss -     TSH -     CBC -     T4, free  Diarrhea, unspecified type  Atherosclerosis of aorta (HCC)  History of Graves' disease -     TSH -     CBC -     T4, free  Hypothyroidism, unspecified type -     TSH -     CBC -     T4, free  Screen for STD (sexually transmitted disease) -     HIV antibody -     RPR -     Hepatitis C antibody -     Hepatitis B surface antigen

## 2016-10-12 LAB — HEPATITIS B SURFACE ANTIGEN: Hepatitis B Surface Ag: NEGATIVE

## 2016-10-12 LAB — RPR

## 2016-10-12 LAB — HIV ANTIBODY (ROUTINE TESTING W REFLEX): HIV: NONREACTIVE

## 2016-10-12 LAB — HEPATITIS C ANTIBODY: HCV Ab: NEGATIVE

## 2016-10-17 ENCOUNTER — Encounter: Payer: Self-pay | Admitting: Medical

## 2016-10-22 ENCOUNTER — Encounter: Payer: Self-pay | Admitting: Medical

## 2016-11-12 ENCOUNTER — Other Ambulatory Visit: Payer: Self-pay | Admitting: Medical

## 2016-11-12 ENCOUNTER — Telehealth: Payer: Self-pay

## 2016-11-12 MED ORDER — LOVASTATIN 10 MG PO TABS: 10.0000 mg | ORAL_TABLET | Freq: Every day | ORAL | 2 refills | Status: DC

## 2016-11-12 NOTE — Telephone Encounter (Signed)
Lets try low dose Mevacor.  Start 1 tablet every other day.  Do this for 2 weeks, then after 2 weeks go to daily if tolerating this.  F/u in 6wk if tolerating this.

## 2016-11-12 NOTE — Telephone Encounter (Signed)
Pt was calling back to let you that she is ready to try another cholesterol  Med. She was told to call back in the middle in of July .

## 2016-11-13 NOTE — Telephone Encounter (Signed)
Spoke with pt to let her know about new meds. And also to let her know that her meds are at the pharmacy. Pt said that she will follow up in 6 weeks.

## 2016-11-13 NOTE — Telephone Encounter (Signed)
Called and l/m for pt call us back.  

## 2017-02-04 ENCOUNTER — Telehealth: Payer: Self-pay | Admitting: Family Medicine

## 2017-02-04 ENCOUNTER — Ambulatory Visit (INDEPENDENT_AMBULATORY_CARE_PROVIDER_SITE_OTHER): Payer: Managed Care, Other (non HMO) | Admitting: Family Medicine

## 2017-02-04 ENCOUNTER — Encounter: Payer: Self-pay | Admitting: Medical

## 2017-02-04 VITALS — BP 130/76 | HR 93 | Resp 16 | Ht 64.0 in

## 2017-02-04 DIAGNOSIS — F430 Acute stress reaction: Secondary | ICD-10-CM

## 2017-02-04 MED ORDER — LORAZEPAM 0.5 MG PO TABS: 0.5000 mg | ORAL_TABLET | Freq: Two times a day (BID) | ORAL | 1 refills | Status: DC | PRN

## 2017-02-04 NOTE — Telephone Encounter (Signed)
[place] t/w Karna Christmas [phone] she scheduled pt an appt on Monday at 8:45 with Marya Amsler Also placed her on cancellation list.  Lewayne Bunting (daughter) [phone] advised of appointment on Monday. Advised of one on one counseling with Hospice [phone] and group sessions.  Patient picked up medications and will see [place] on Monday afternoon.

## 2017-02-04 NOTE — Telephone Encounter (Signed)
Called Heather to see if pt had gotten medicine to see if helping.  Reached Clinical biochemist.

## 2017-02-04 NOTE — Telephone Encounter (Signed)
Heather called and states meds are helping some.  She has someone staying with [employer] while she works.  Reminded her of [facility] if needed.
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
## 2017-02-11 ENCOUNTER — Ambulatory Visit (INDEPENDENT_AMBULATORY_CARE_PROVIDER_SITE_OTHER): Payer: Managed Care, Other (non HMO) | Admitting: Licensed Clinical Social Worker

## 2017-02-11 ENCOUNTER — Encounter: Payer: Self-pay | Admitting: Family Medicine

## 2017-02-11 ENCOUNTER — Ambulatory Visit (INDEPENDENT_AMBULATORY_CARE_PROVIDER_SITE_OTHER): Payer: Managed Care, Other (non HMO) | Admitting: Family Medicine

## 2017-02-11 VITALS — BP 102/60 | HR 85 | Resp 16 | Ht 64.0 in | Wt 114.4 lb

## 2017-02-11 DIAGNOSIS — F430 Acute stress reaction: Secondary | ICD-10-CM | POA: Diagnosis not present

## 2017-02-11 DIAGNOSIS — F4323 Adjustment disorder with mixed anxiety and depressed mood: Secondary | ICD-10-CM

## 2017-02-11 NOTE — Progress Notes (Signed)
   Subjective:    [name], female    DOB: 1955-09-20, 61 y.o.   MRN: [id number]  HPI She is here for recheck. She seems be doing better and did have a good night sleep last night. She was seen by her therapist, Marya Amsler and is scheduled to be seen again next week. She has been in the process of cleaning the house up and repainting it in order to have a new start. She seems be handling this well. He continues on twice a day use of Ativan and has started using more towards night help with sleep.   Review of Systems     Objective:   Physical Exam Alert and in no distress with a slightly flat affect       Assessment & Plan:  Acute stress reaction She will continue in counseling. Recommend she use the second dose of Ativan closer to bedtime to see if this will help with sleep. I will renew this medication and potentially change to an antidepressant based on further intervention from her therapist. I will follow-up as needed. She is comfortable with this.

## 2017-02-21 ENCOUNTER — Ambulatory Visit (INDEPENDENT_AMBULATORY_CARE_PROVIDER_SITE_OTHER): Payer: Managed Care, Other (non HMO) | Admitting: Licensed Clinical Social Worker

## 2017-02-21 ENCOUNTER — Ambulatory Visit: Payer: Managed Care, Other (non HMO) | Admitting: Licensed Clinical Social Worker

## 2017-02-21 DIAGNOSIS — F4323 Adjustment disorder with mixed anxiety and depressed mood: Secondary | ICD-10-CM | POA: Diagnosis not present

## 2017-04-19 ENCOUNTER — Other Ambulatory Visit: Payer: Self-pay | Admitting: Medical

## 2017-08-26 ENCOUNTER — Other Ambulatory Visit: Payer: Self-pay | Admitting: Medical

## 2017-09-17 ENCOUNTER — Other Ambulatory Visit: Payer: Self-pay | Admitting: Medical

## 2017-10-03 ENCOUNTER — Other Ambulatory Visit: Payer: Self-pay

## 2017-10-03 NOTE — Telephone Encounter (Signed)
Is this refill request appropriate?

## 2017-10-04 MED ORDER — IBANDRONATE SODIUM 150 MG PO TABS: ORAL_TABLET | ORAL | 0 refills | Status: DC

## 2017-10-04 NOTE — Telephone Encounter (Signed)
Refill Boniva 90 days but get in for physical

## 2017-10-04 NOTE — Telephone Encounter (Signed)
Called and left message for pt to call back and schedule her CPE.

## 2017-11-18 ENCOUNTER — Encounter: Payer: Self-pay | Admitting: Gastroenterology

## 2017-11-18 ENCOUNTER — Ambulatory Visit (INDEPENDENT_AMBULATORY_CARE_PROVIDER_SITE_OTHER): Payer: Managed Care, Other (non HMO) | Admitting: Medical

## 2017-11-18 VITALS — BP 130/80 | HR 73 | Temp 97.5°F | Resp 16 | Ht 65.0 in | Wt 120.0 lb

## 2017-11-18 DIAGNOSIS — E038 Other specified hypothyroidism: Secondary | ICD-10-CM

## 2017-11-18 DIAGNOSIS — K635 Polyp of colon: Secondary | ICD-10-CM

## 2017-11-18 DIAGNOSIS — Z1231 Encounter for screening mammogram for malignant neoplasm of breast: Secondary | ICD-10-CM | POA: Diagnosis not present

## 2017-11-18 DIAGNOSIS — R3129 Other microscopic hematuria: Secondary | ICD-10-CM | POA: Diagnosis not present

## 2017-11-18 DIAGNOSIS — Z0000 Encounter for general adult medical examination without abnormal findings: Secondary | ICD-10-CM | POA: Diagnosis not present

## 2017-11-18 DIAGNOSIS — I700 Atherosclerosis of aorta: Secondary | ICD-10-CM

## 2017-11-18 DIAGNOSIS — I4510 Unspecified right bundle-branch block: Secondary | ICD-10-CM

## 2017-11-18 DIAGNOSIS — Z8249 Family history of ischemic heart disease and other diseases of the circulatory system: Secondary | ICD-10-CM

## 2017-11-18 DIAGNOSIS — M810 Age-related osteoporosis without current pathological fracture: Secondary | ICD-10-CM

## 2017-11-18 DIAGNOSIS — Z780 Asymptomatic menopausal state: Secondary | ICD-10-CM

## 2017-11-18 DIAGNOSIS — Z1211 Encounter for screening for malignant neoplasm of colon: Secondary | ICD-10-CM | POA: Insufficient documentation

## 2017-11-18 DIAGNOSIS — Z8639 Personal history of other endocrine, nutritional and metabolic disease: Secondary | ICD-10-CM

## 2017-11-18 DIAGNOSIS — Z124 Encounter for screening for malignant neoplasm of cervix: Secondary | ICD-10-CM | POA: Diagnosis not present

## 2017-11-18 DIAGNOSIS — F17200 Nicotine dependence, unspecified, uncomplicated: Secondary | ICD-10-CM

## 2017-11-18 DIAGNOSIS — Z1239 Encounter for other screening for malignant neoplasm of breast: Secondary | ICD-10-CM | POA: Insufficient documentation

## 2017-11-18 LAB — POCT URINALYSIS DIP (PROADVANTAGE DEVICE)
Bilirubin, UA: NEGATIVE
GLUCOSE UA: NEGATIVE mg/dL
Ketones, POC UA: NEGATIVE mg/dL
Nitrite, UA: NEGATIVE
PROTEIN UA: NEGATIVE mg/dL
SPECIFIC GRAVITY, URINE: 1.02
UUROB: 3.5
pH, UA: 6 (ref 5.0–8.0)

## 2017-11-18 NOTE — Progress Notes (Signed)
Subjective:   HPI  Jodi Flynn is a 62 y.o. female who presents for a complete physical.  Accompanied by her new boyfriend  Concerns: No particular issues.  Some arthritis pain right thumb  Reviewed their medical, surgical, family, social, medication, and allergy history and updated chart as appropriate.  Past Medical History:  Diagnosis Date  . Allergy   . Family history of premature coronary heart disease   . Grave's disease   . History of cervical cancer    s/p hysterectomy  . History of renal stone   . Hx of colonic polyps 2007  . Osteoporosis 01/2009   Bone Density 02/2014 no change in density, thus changed from Fosamax to Boniva  . Tobacco use disorder   . Wears glasses     Past Surgical History:  Procedure Laterality Date  . ABDOMINAL HYSTERECTOMY     ovaries intact; age 26yo  . COLONOSCOPY  age 1, 12/12/05   Dr. Verl Blalock  . COLPOSCOPY     age 30  . VEIN SURGERY  1/12   laser ablation left great saphenous vein , stab phlebectomy of multiple varicosities, Dr. Donnetta Hutching     Social History   Socioeconomic History  . Marital status: Widowed    Spouse name: Not on file  . Number of children: Not on file  . Years of education: Not on file  . Highest education level: Not on file  Occupational History  . Occupation: Engineer, drilling for [employer]    Employer: [employer]  . Financial resource strain: Not on file  . Food insecurity:    Worry: Not on file    Inability: Not on file  . Transportation needs:    Medical: Not on file    Non-medical: Not on file  Tobacco Use  . Smoking status: Current Some Day Smoker    Packs/day: 0.50    Years: 40.00    Pack years: 20.00    Types: Cigarettes  . Smokeless tobacco: Never Used  Substance and Sexual Activity  . Alcohol use: Yes    Alcohol/week: 4.2 oz    Types: 5 Glasses of wine, 2 Standard drinks or equivalent per week  . Drug use: No  . Sexual activity: Yes  Lifestyle  .  Physical activity:    Days per week: Not on file    Minutes per session: Not on file  . Stress: Not on file  Relationships  . Social connections:    Talks on phone: Not on file    Gets together: Not on file    Attends religious service: Not on file    Active member of club or organization: Not on file    Attends meetings of clubs or organizations: Not on file    Relationship status: Not on file  . Intimate partner violence:    Fear of current or ex partner: Not on file    Emotionally abused: Not on file    Physically abused: Not on file    Forced sexual activity: Not on file  Other Topics Concern  . Not on file  Social History Narrative   Widowed in 2010. 2 children, walks for exercise, hobbies: puzzles.  Boyfriend of 2 years committed suicide (in front of her) 05/2012    Family History  Problem Relation Age of Onset  . COPD Mother        respiratory failure  . Kidney disease Mother   . Hypertension Mother   . Hyperlipidemia  Mother   . Heart disease Father        died of MI, late 88s  . Hypertension Father   . Hyperlipidemia Father   . Stroke Father   . Hypertension Brother   . Hyperlipidemia Brother   . Kidney disease Brother        kidney stones  . Diabetes Brother        borderline  . Hyperlipidemia Brother   . Hypertension Brother   . Cancer Neg Hx      Current Outpatient Medications:  .  aspirin EC 81 MG tablet, Take 1 tablet (81 mg total) by mouth daily., Disp: 90 tablet, Rfl: 3 .  calcium-vitamin D (OSCAL WITH D) 500-200 MG-UNIT per tablet, Take 1 tablet by mouth daily. , Disp: , Rfl:  .  Cyanocobalamin (VITAMIN B 12 PO), Take by mouth., Disp: , Rfl:  .  fish oil-omega-3 fatty acids 1000 MG capsule, Take 2 g by mouth daily. , Disp: , Rfl:  .  ibandronate (BONIVA) 150 MG tablet, Take 1 Q30 days in the am with a full glass of water on empty stomach, & do not take anything else by mouth or lie down for the next 30 min., Disp: 3 tablet, Rfl: 0 .  levothyroxine  (SYNTHROID, LEVOTHROID) 100 MCG tablet, TAKE 1 TABLET BY MOUTH ONCE DAILY BEFORE BREAKFAST, Disp: 90 tablet, Rfl: 0 .  lovastatin (MEVACOR) 10 MG tablet, TAKE 1 TABLET BY MOUTH AT BEDTIME, Disp: 30 tablet, Rfl: 2 .  Multiple Vitamin (MULTIVITAMIN WITH MINERALS) TABS, Take 1 tablet by mouth daily., Disp: , Rfl:   Allergies  Allergen Reactions  . Pravachol [Pravastatin Sodium]     Fatigue, weight loss     Review of Systems Constitutional: -fever, -chills, -sweats, -unexpected weight change, -decreased appetite, -fatigue Allergy: -sneezing, -itching, -congestion Dermatology: -changing moles, --rash, -lumps ENT: -runny nose, -ear pain, -sore throat, -hoarseness, -sinus pain, -teeth pain, - ringing in ears, -hearing loss, -nosebleeds Cardiology: -chest pain, -palpitations, -swelling, -difficulty breathing when lying flat, -waking up short of breath Respiratory: -cough, -shortness of breath, -difficulty breathing with exercise or exertion, -wheezing, -coughing up blood Gastroenterology: -abdominal pain, -nausea, -vomiting, -diarrhea, -constipation, -blood in stool, -changes in bowel movement, -difficulty swallowing or eating Hematology: -bleeding, -bruising  Musculoskeletal: +joint aches, -muscle aches, -joint swelling, -back pain, -neck pain, +cramping, -changes in gait Ophthalmology: denies vision changes, eye redness, itching, discharge Urology: -burning with urination, -difficulty urinating, -blood in urine, -urinary frequency, -urgency, -incontinence Neurology: -headache, -weakness, -tingling, -numbness, -memory loss, -falls, -dizziness Psychology: -depressed mood, -agitation, -sleep problems     Objective:   Physical Exam  BP 130/80   Pulse 73   Temp (!) 97.5 F (36.4 C) (Oral)   Resp 16   Ht 5\' 5"  (1.651 m)   Wt 120 lb (54.4 kg)   SpO2 99%   BMI 19.97 kg/m   General appearance: alert, no distress, WD/WN, lean white female  Skin: right preauricular region with 2 separate  raised flesh colored 27mm papules, left ear tragus with vertical fleshy appendage skin that she notes has been unchanged for years, solar keratosis of bilat forearms and hands , scattered cherry hemangiomas of torso HEENT: normocephalic, conjunctiva/corneas normal, sclerae anicteric, PERRLA, EOMi, nares patent, no discharge or erythema, pharynx normal  Oral cavity: MMM, tongue normal, teeth in good repair  Neck: supple, no lymphadenopathy, no thyromegaly, no masses, normal ROM, no bruits  Chest: non tender, normal shape and expansion  Heart: RRR, normal S1, S2, no  murmurs  Lungs: CTA bilaterally, no wheezes, rhonchi, or rales  Abdomen: +bs, soft, non tender, non distended, no masses, no hepatomegaly, no splenomegaly, faint bruit over mid aorta of abdomen Back: non tender, normal ROM, no scoliosis  Musculoskeletal: mild bony arthritic changes of bilat 5th finger PIP, right 4th toe MTP with bony arthritis change, right MCP of thumb with bony arthritic change, otherwise upper extremities non tender, no obvious deformity, normal ROM throughout, lower extremities non tender, no obvious deformity, normal ROM throughout  Extremities: no edema, no cyanosis, no clubbing  Pulses: 1+ symmetric, upper  extremities, normal cap refill , pedal pulses 1+ Neurological: alert, oriented x 3, CN2-12 intact, strength normal upper extremities and lower extremities, sensation normal throughout, DTRs 2+ throughout, no cerebellar signs, gait normal  Psychiatric: normal affect, behavior normal, pleasant   Breast: nontender, nipple piercing's present bilat, no masses or lumps, no skin changes, no nipple discharge or inversion, no axillary lymphadenopathy Gyn: Normal external genitalia without lesions, vagina with atrophic changes, s/p hysterectomy, no abnormal vaginal discharge.  Adnexa not enlarged, nontender, no masses.   Exam chaperoned by nurse. Rectal: anus normal appearing    Assessment and Plan :    Encounter  Diagnoses  Name Primary?  . Routine general medical examination at a health care facility Yes  . Encounter for health maintenance examination in adult   . History of Graves' disease   . Family history of premature coronary heart disease   . Microscopic hematuria   . Osteoporosis, unspecified osteoporosis type, unspecified pathological fracture presence   . Other specified hypothyroidism   . Polyp of colon, unspecified part of colon, unspecified type   . Atherosclerosis of aorta ([place])   . RBBB   . Post-menopausal   . Screening for breast cancer   . Screening for cervical cancer   . Smoker   . Screen for colon cancer     Physical exam - discussed healthy lifestyle, diet, exercise, preventative care, vaccinations, and addressed their concerns.   Osteoporosis - compliant with Boniva, call and schedule bone density exam.   Get mammogram soon Advised chest CT lung cancer screen Hypothyroidism, hx/o graves - c/t current medication, labs today Smoker - counseled on cessation.  She is not ready to quit Referral back to GI for updated colonoscopy, Dr. Sharlett Iles F/u with eye doctor, dentist Follow-up pending studies   Lakechia was seen today for fasting cpe.  Diagnoses and all orders for this visit:  Routine general medical examination at a health care facility -     POCT Urinalysis DIP (Proadvantage Device) -     Comprehensive metabolic panel -     CBC -     Lipid panel -     TSH -     T4, free -     Cancel: Cytology - PAP  Encounter for health maintenance examination in adult  History of Graves' disease  Family history of premature coronary heart disease  Microscopic hematuria  Osteoporosis, unspecified osteoporosis type, unspecified pathological fracture presence -     DG Bone Density; Future -     DG Bone Density; Future  Other specified hypothyroidism -     TSH -     T4, free  Polyp of colon, unspecified part of colon, unspecified type  Atherosclerosis of aorta  ([facility]) -     Lipid panel  RBBB  Post-menopausal -     DG Bone Density; Future -     DG Bone Density; Future  Screening  for breast cancer -     MM DIGITAL SCREENING BILATERAL; Future -     MM DIGITAL SCREENING BILATERAL; Future  Screening for cervical cancer -     Cancel: Cytology - PAP  Smoker  Screen for colon cancer

## 2017-11-18 NOTE — Addendum Note (Signed)
Addended by: Carlena Hurl on: 11/18/2017 09:05 AM   Modules accepted: Orders

## 2017-11-18 NOTE — Patient Instructions (Signed)
Recommendations  Call and schedule your bone density and mammogram as they can be scheduled on the same day  Unfortunately you will need to remove the nipple rings  We will go ahead and refer you back for updated colonoscopy  1 other cancer screening you can consider is a screening chest CT scan.   I recommend you call your insurance and ask what your coverage would be for a screening chest CT for lung cancer  Get your flu shot yearly

## 2017-11-19 ENCOUNTER — Other Ambulatory Visit: Payer: Self-pay | Admitting: Medical

## 2017-11-19 LAB — COMPREHENSIVE METABOLIC PANEL
ALBUMIN: 4.3 g/dL (ref 3.6–4.8)
ALT: 15 IU/L (ref 0–32)
AST: 17 IU/L (ref 0–40)
Albumin/Globulin Ratio: 2.4 — ABNORMAL HIGH (ref 1.2–2.2)
Alkaline Phosphatase: 36 IU/L — ABNORMAL LOW (ref 39–117)
BILIRUBIN TOTAL: 0.4 mg/dL (ref 0.0–1.2)
BUN/Creatinine Ratio: 24 (ref 12–28)
BUN: 19 mg/dL (ref 8–27)
CHLORIDE: 102 mmol/L (ref 96–106)
CO2: 25 mmol/L (ref 20–29)
Calcium: 9.6 mg/dL (ref 8.7–10.3)
Creatinine, Ser: 0.8 mg/dL (ref 0.57–1.00)
GFR calc non Af Amer: 79 mL/min/{1.73_m2} (ref >59)
GFR, EST AFRICAN AMERICAN: 91 mL/min/{1.73_m2} (ref >59)
Globulin, Total: 1.8 g/dL (ref 1.5–4.5)
Glucose: 101 mg/dL — ABNORMAL HIGH (ref 65–99)
Potassium: 4.9 mmol/L (ref 3.5–5.2)
Sodium: 142 mmol/L (ref 134–144)
TOTAL PROTEIN: 6.1 g/dL (ref 6.0–8.5)

## 2017-11-19 LAB — LIPID PANEL
CHOL/HDL RATIO: 1.8 ratio (ref 0.0–4.4)
Cholesterol, Total: 183 mg/dL (ref 100–199)
HDL: 99 mg/dL (ref >39)
LDL Calculated: 73 mg/dL (ref 0–99)
TRIGLYCERIDES: 54 mg/dL (ref 0–149)
VLDL Cholesterol Cal: 11 mg/dL (ref 5–40)

## 2017-11-19 LAB — TSH: TSH: 7.14 u[IU]/mL — AB (ref 0.450–4.500)

## 2017-11-19 LAB — CBC
HEMATOCRIT: 42.2 % (ref 34.0–46.6)
HEMOGLOBIN: 14 g/dL (ref 11.1–15.9)
MCH: 32 pg (ref 26.6–33.0)
MCHC: 33.2 g/dL (ref 31.5–35.7)
MCV: 97 fL (ref 79–97)
Platelets: 314 10*3/uL (ref 150–450)
RBC: 4.37 x10E6/uL (ref 3.77–5.28)
RDW: 13.4 % (ref 12.3–15.4)
WBC: 7.7 10*3/uL (ref 3.4–10.8)

## 2017-11-19 LAB — T4, FREE: Free T4: 1.41 ng/dL (ref 0.82–1.77)

## 2017-11-19 MED ORDER — LOVASTATIN 10 MG PO TABS: 10.0000 mg | ORAL_TABLET | Freq: Every day | ORAL | 3 refills | Status: DC

## 2017-11-19 MED ORDER — ASPIRIN EC 81 MG PO TBEC: 81.0000 mg | DELAYED_RELEASE_TABLET | Freq: Every day | ORAL | 3 refills | Status: DC

## 2017-11-19 MED ORDER — IBANDRONATE SODIUM 150 MG PO TABS: ORAL_TABLET | ORAL | 3 refills | Status: DC

## 2017-11-19 MED ORDER — LEVOTHYROXINE SODIUM 100 MCG PO TABS: ORAL_TABLET | ORAL | 3 refills | Status: DC

## 2017-11-28 ENCOUNTER — Telehealth: Payer: Self-pay

## 2017-11-28 ENCOUNTER — Other Ambulatory Visit (INDEPENDENT_AMBULATORY_CARE_PROVIDER_SITE_OTHER): Payer: Managed Care, Other (non HMO)

## 2017-11-28 DIAGNOSIS — R3129 Other microscopic hematuria: Secondary | ICD-10-CM

## 2017-11-28 HISTORY — PX: COLONOSCOPY: SHX5424

## 2017-11-28 LAB — POCT URINALYSIS DIP (PROADVANTAGE DEVICE)
BILIRUBIN UA: NEGATIVE
Glucose, UA: NEGATIVE mg/dL
Ketones, POC UA: NEGATIVE mg/dL
LEUKOCYTES UA: NEGATIVE
NITRITE UA: NEGATIVE
PH UA: 7.5 (ref 5.0–8.0)
Protein Ur, POC: NEGATIVE mg/dL
RBC UA: NEGATIVE
Specific Gravity, Urine: 1.015
UUROB: 3.5

## 2017-11-28 NOTE — Telephone Encounter (Signed)
Dr Redmond School this is a Jodi Flynn patient that he wanted to come back in to do a UA.  She came in today and her UA is in.  Thank you

## 2017-11-28 NOTE — Telephone Encounter (Signed)
Let her know that the urinalysis is normal

## 2017-11-28 NOTE — Telephone Encounter (Signed)
Pt called and LVM that U/A Is normal and if she had any question to call back [facility]

## 2017-12-06 ENCOUNTER — Ambulatory Visit (AMBULATORY_SURGERY_CENTER): Payer: Self-pay | Admitting: *Deleted

## 2017-12-06 VITALS — Ht 64.0 in | Wt 121.0 lb

## 2017-12-06 DIAGNOSIS — Z1211 Encounter for screening for malignant neoplasm of colon: Secondary | ICD-10-CM

## 2017-12-06 MED ORDER — PEG-KCL-NACL-NASULF-NA ASC-C 140 G PO SOLR: 1.0000 | Freq: Once | ORAL | 0 refills | Status: AC

## 2017-12-06 NOTE — Progress Notes (Signed)
Denies allergies to eggs or soy products. Denies complications with sedation or anesthesia. Denies O2 use. Denies use of diet or weight loss medications.  Emmi instructions given for colonoscopy.  

## 2017-12-12 ENCOUNTER — Encounter: Payer: Self-pay | Admitting: Gastroenterology

## 2017-12-12 ENCOUNTER — Ambulatory Visit (AMBULATORY_SURGERY_CENTER): Payer: Managed Care, Other (non HMO) | Admitting: Gastroenterology

## 2017-12-12 VITALS — BP 127/89 | HR 53 | Temp 98.4°F | Resp 17 | Ht 64.0 in | Wt 121.0 lb

## 2017-12-12 DIAGNOSIS — Z1211 Encounter for screening for malignant neoplasm of colon: Secondary | ICD-10-CM | POA: Diagnosis present

## 2017-12-12 NOTE — Op Note (Signed)
[facility] Patient Name: Jodi Flynn Procedure Date: 12/12/2017 8:33 AM MRN: [id number] Endoscopist: Dougherty. Loletha Carrow , MD Age: 62 Referring MD:  Date of Birth: Apr 28, 1956 Gender: Female Account #: [id number] Procedure:                Colonoscopy Indications:              Screening for colorectal malignant neoplasm (no                            adenomatous polyps last colonoscopy 11/2005) Medicines:                Monitored Anesthesia Care Procedure:                Pre-Anesthesia Assessment:                           - Prior to the procedure, a History and Physical                            was performed, and patient medications and                            allergies were reviewed. The patient's tolerance of                            previous anesthesia was also reviewed. The risks                            and benefits of the procedure and the sedation                            options and risks were discussed with the patient.                            All questions were answered, and informed consent                            was obtained. Anticoagulants: The patient has taken                            aspirin. It was decided not to withhold this                            medication prior to the procedure. ASA Grade                            Assessment: II - A patient with mild systemic                            disease. After reviewing the risks and benefits,                            the patient was deemed in satisfactory condition to  undergo the procedure.                           After obtaining informed consent, the colonoscope                            was passed under direct vision. Throughout the                            procedure, the patient's blood pressure, pulse, and                            oxygen saturations were monitored continuously. The                            Model PCF-[id number]) scope was  introduced                            through the anus and advanced to the the cecum,                            identified by appendiceal orifice and ileocecal                            valve. The colonoscopy was performed without                            difficulty. The patient tolerated the procedure                            well. The quality of the bowel preparation was                            excellent. The ileocecal valve, appendiceal                            orifice, and rectum were photographed. The quality                            of the bowel preparation was evaluated using the                            BBPS [facility] Bowel Preparation Scale) with scores                            of: Right Colon = 3, Transverse Colon = 3 and Left                            Colon = 3 (entire mucosa seen well with no residual                            staining, small fragments of stool or opaque  liquid). The total BBPS score equals 9. Scope In: 8:40:35 AM Scope Out: 8:53:19 AM Scope Withdrawal Time: 0 hours 8 minutes 23 seconds  Total Procedure Duration: 0 hours 12 minutes 44 seconds  Findings:                 The perianal and digital rectal examinations were                            normal.                           The entire examined colon appeared normal on direct                            and retroflexion views. Complications:            No immediate complications. Estimated Blood Loss:     Estimated blood loss: none. Impression:               - The entire examined colon is normal on direct and                            retroflexion views.                           - No specimens collected. Recommendation:           - Patient has a contact number available for                            emergencies. The signs and symptoms of potential                            delayed complications were discussed with the                            patient.  Return to normal activities tomorrow.                            Written discharge instructions were provided to the                            patient.                           - Resume previous diet.                           - Continue present medications.                           - Repeat colonoscopy in 10 years for screening                            purposes. Magda Muise L. Loletha Carrow, MD 12/12/2017 8:56:28 AM This report has been signed electronically.

## 2017-12-12 NOTE — Patient Instructions (Signed)
Impression/Recommendations:  Resume previous diet. Continue present medications.  Repeat colonoscopy in 10 years for screening purposes.  YOU HAD AN ENDOSCOPIC PROCEDURE TODAY AT [facility]:   Refer to the procedure report that was given to you for any specific questions about what was found during the examination.  If the procedure report does not answer your questions, please call your gastroenterologist to clarify.  If you requested that your care partner not be given the details of your procedure findings, then the procedure report has been included in a sealed envelope for you to review at your convenience later.  YOU SHOULD EXPECT: Some feelings of bloating in the abdomen. Passage of more gas than usual.  Walking can help get rid of the air that was put into your GI tract during the procedure and reduce the bloating. If you had a lower endoscopy (such as a colonoscopy or flexible sigmoidoscopy) you may notice spotting of blood in your stool or on the toilet paper. If you underwent a bowel prep for your procedure, you may not have a normal bowel movement for a few days.  Please Note:  You might notice some irritation and congestion in your nose or some drainage.  This is from the oxygen used during your procedure.  There is no need for concern and it should clear up in a day or so.  SYMPTOMS TO REPORT IMMEDIATELY:   Following lower endoscopy (colonoscopy or flexible sigmoidoscopy):  Excessive amounts of blood in the stool  Significant tenderness or worsening of abdominal pains  Swelling of the abdomen that is new, acute  Fever of 100F or higher For urgent or emergent issues, a gastroenterologist can be reached at any hour by calling [phone].   DIET:  We do recommend a small meal at first, but then you may proceed to your regular diet.  Drink plenty of fluids but you should avoid alcoholic beverages for 24 hours.  ACTIVITY:  You should plan to take it easy for  the rest of today and you should NOT DRIVE or use heavy machinery until tomorrow (because of the sedation medicines used during the test).    FOLLOW UP: Our staff will call the number listed on your records the next business day following your procedure to check on you and address any questions or concerns that you may have regarding the information given to you following your procedure. If we do not reach you, we will leave a message.  However, if you are feeling well and you are not experiencing any problems, there is no need to return our call.  We will assume that you have returned to your regular daily activities without incident.  If any biopsies were taken you will be contacted by phone or by letter within the next 1-3 weeks.  Please call us at [phone] if you have not heard about the biopsies in 3 weeks.    SIGNATURES/CONFIDENTIALITY: You and/or your care partner have signed paperwork which will be entered into your electronic medical record.  These signatures attest to the fact that that the information above on your After Visit Summary has been reviewed and is understood.  Full responsibility of the confidentiality of this discharge information lies with you and/or your care-partner.

## 2017-12-12 NOTE — Progress Notes (Signed)
Report given to PACU, vss 

## 2017-12-13 ENCOUNTER — Telehealth: Payer: Self-pay

## 2017-12-13 ENCOUNTER — Telehealth: Payer: Self-pay | Admitting: *Deleted

## 2017-12-13 NOTE — Telephone Encounter (Signed)
Left message on answering machine. 

## 2017-12-13 NOTE — Telephone Encounter (Signed)
  Follow up Call-  Call back number 12/12/2017  Post procedure Call Back phone  # [phone]  Permission to leave phone message Yes  Some recent data might be hidden     Patient questions:  Message left to call us if necessary. Second call.

## 2018-06-20 ENCOUNTER — Telehealth: Payer: Self-pay | Admitting: Medical

## 2018-06-20 NOTE — Telephone Encounter (Signed)
Please call and remind her to get the mammogram and bone density tests.  I received reminder from her insurer that she still is not up to date on these tests

## 2018-06-24 NOTE — Telephone Encounter (Signed)
lmom reminding patient she needs to schedule her mammogram and bone density testing if she has not already done so.

## 2018-08-27 ENCOUNTER — Ambulatory Visit: Payer: Self-pay

## 2018-08-27 ENCOUNTER — Inpatient Hospital Stay: Admission: RE | Admit: 2018-08-27 | Payer: Self-pay | Source: Ambulatory Visit

## 2018-10-24 ENCOUNTER — Ambulatory Visit
Admission: RE | Admit: 2018-10-24 | Discharge: 2018-10-24 | Disposition: A | Discharge status: Home / Self Care | Payer: Managed Care, Other (non HMO) | Source: Ambulatory Visit | Attending: Medical | Admitting: Medical

## 2018-10-24 ENCOUNTER — Other Ambulatory Visit: Payer: Self-pay

## 2018-10-24 DIAGNOSIS — Z1239 Encounter for other screening for malignant neoplasm of breast: Secondary | ICD-10-CM

## 2018-10-24 DIAGNOSIS — Z780 Asymptomatic menopausal state: Secondary | ICD-10-CM

## 2018-10-24 DIAGNOSIS — M810 Age-related osteoporosis without current pathological fracture: Secondary | ICD-10-CM

## 2018-11-19 ENCOUNTER — Telehealth: Payer: Self-pay | Admitting: Medical

## 2018-11-19 ENCOUNTER — Other Ambulatory Visit: Payer: Self-pay

## 2018-11-19 ENCOUNTER — Encounter: Payer: Self-pay | Admitting: Medical

## 2018-11-19 ENCOUNTER — Ambulatory Visit (INDEPENDENT_AMBULATORY_CARE_PROVIDER_SITE_OTHER): Payer: Managed Care, Other (non HMO) | Admitting: Medical

## 2018-11-19 VITALS — BP 112/70 | HR 60 | Temp 98.6°F | Resp 16 | Ht 65.0 in | Wt 125.0 lb

## 2018-11-19 DIAGNOSIS — E559 Vitamin D deficiency, unspecified: Secondary | ICD-10-CM

## 2018-11-19 DIAGNOSIS — Z8639 Personal history of other endocrine, nutritional and metabolic disease: Secondary | ICD-10-CM

## 2018-11-19 DIAGNOSIS — R0989 Other specified symptoms and signs involving the circulatory and respiratory systems: Secondary | ICD-10-CM

## 2018-11-19 DIAGNOSIS — E785 Hyperlipidemia, unspecified: Secondary | ICD-10-CM | POA: Insufficient documentation

## 2018-11-19 DIAGNOSIS — Z780 Asymptomatic menopausal state: Secondary | ICD-10-CM

## 2018-11-19 DIAGNOSIS — I4510 Unspecified right bundle-branch block: Secondary | ICD-10-CM

## 2018-11-19 DIAGNOSIS — Z0000 Encounter for general adult medical examination without abnormal findings: Secondary | ICD-10-CM | POA: Diagnosis not present

## 2018-11-19 DIAGNOSIS — F17200 Nicotine dependence, unspecified, uncomplicated: Secondary | ICD-10-CM

## 2018-11-19 DIAGNOSIS — E038 Other specified hypothyroidism: Secondary | ICD-10-CM | POA: Diagnosis not present

## 2018-11-19 DIAGNOSIS — M8580 Other specified disorders of bone density and structure, unspecified site: Secondary | ICD-10-CM | POA: Insufficient documentation

## 2018-11-19 DIAGNOSIS — Z8249 Family history of ischemic heart disease and other diseases of the circulatory system: Secondary | ICD-10-CM | POA: Diagnosis not present

## 2018-11-19 DIAGNOSIS — I700 Atherosclerosis of aorta: Secondary | ICD-10-CM

## 2018-11-19 NOTE — Telephone Encounter (Signed)
Please see if we can send/fax her previous bone density scan from [facility] from 2015 to Dr. Thornton Papas with the breast center to compare to the most recent bone density scan.  I would like to try and get an amended report after they compare the 2015 bone density scan with the more recent one.    You can send this note as well.  She is on Boniva.  There was a chart history showing listed history of osteoporosis in the past, she is a lean white female postmenopausal age 63 years old smoker.

## 2018-11-19 NOTE — Progress Notes (Signed)
Subjective:   HPI  Jodi Flynn is a 62 y.o. female who presents for Chief Complaint  Patient presents with  . CPE    fasting CPE   eye exam this year     Patient Care Team: Azelia Reiger, Camelia Eng, PA-C as PCP - General (Family Medicine) Sees dentist Sees eye doctor  Concerns: none  Reviewed their medical, surgical, family, social, medication, and allergy history and updated chart as appropriate.  Past Medical History:  Diagnosis Date  . Allergy   . Family history of premature coronary heart disease   . Grave's disease   . History of cervical cancer    s/p hysterectomy  . History of renal stone   . Hx of colonic polyps 2007  . Osteopenia    bone density scans 2015, 2020  . Tobacco use disorder   . Wears glasses     Past Surgical History:  Procedure Laterality Date  . ABDOMINAL HYSTERECTOMY     ovaries intact; age 85yo  . COLONOSCOPY  age 31, 12/12/05   Dr. Verl Blalock  . COLONOSCOPY  11/2017   normal; Dr. Wilfrid Lund  . COLPOSCOPY     age 66  . VEIN SURGERY  1/12   laser ablation left great saphenous vein , stab phlebectomy of multiple varicosities, Dr. Donnetta Hutching     Social History   Socioeconomic History  . Marital status: Widowed    Spouse name: Not on file  . Number of children: Not on file  . Years of education: Not on file  . Highest education level: Not on file  Occupational History  . Occupation: Engineer, drilling for [employer]    Employer: [employer]  . Financial resource strain: Not on file  . Food insecurity    Worry: Not on file    Inability: Not on file  . Transportation needs    Medical: Not on file    Non-medical: Not on file  Tobacco Use  . Smoking status: Current Some Day Smoker    Packs/day: 0.50    Years: 42.00    Pack years: 21.00    Types: Cigarettes  . Smokeless tobacco: Never Used  Substance and Sexual Activity  . Alcohol use: Not Currently    Alcohol/week: 7.0 standard drinks    Types: 5 Glasses of  wine, 2 Standard drinks or equivalent per week  . Drug use: No  . Sexual activity: Yes  Lifestyle  . Physical activity    Days per week: Not on file    Minutes per session: Not on file  . Stress: Not on file  Relationships  . Social Herbalist on phone: Not on file    Gets together: Not on file    Attends religious service: Not on file    Active member of club or organization: Not on file    Attends meetings of clubs or organizations: Not on file    Relationship status: Not on file  . Intimate partner violence    Fear of current or ex partner: Not on file    Emotionally abused: Not on file    Physically abused: Not on file    Forced sexual activity: Not on file  Other Topics Concern  . Not on file  Social History Narrative   Widowed in 2010. 2 children, walks for exercise, hobbies: puzzles.  Boyfriend of 2 years committed suicide (in front of her) 10/2018    Family History  Problem Relation  Age of Onset  . COPD Mother        respiratory failure  . Kidney disease Mother   . Hypertension Mother   . Hyperlipidemia Mother   . Heart disease Father        died of MI, late 65s  . Hypertension Father   . Hyperlipidemia Father   . Stroke Father   . Hypertension Brother   . Hyperlipidemia Brother   . Kidney disease Brother        kidney stones  . Diabetes Brother        borderline  . Hyperlipidemia Brother   . Hypertension Brother   . Cancer Neg Hx   . Colon cancer Neg Hx   . Esophageal cancer Neg Hx   . Rectal cancer Neg Hx   . Stomach cancer Neg Hx      Current Outpatient Medications:  .  aspirin EC 81 MG tablet, Take 1 tablet (81 mg total) by mouth daily., Disp: 90 tablet, Rfl: 3 .  calcium-vitamin D (OSCAL WITH D) 500-200 MG-UNIT per tablet, Take 1 tablet by mouth daily. , Disp: , Rfl:  .  Cyanocobalamin (VITAMIN B 12 PO), Take by mouth., Disp: , Rfl:  .  fish oil-omega-3 fatty acids 1000 MG capsule, Take 2 g by mouth daily. , Disp: , Rfl:  .   ibandronate (BONIVA) 150 MG tablet, Take 1 Q30 days in the am with a full glass of water on empty stomach, & do not take anything else by mouth or lie down for the next 30 min., Disp: 3 tablet, Rfl: 3 .  levothyroxine (SYNTHROID, LEVOTHROID) 100 MCG tablet, TAKE 1 TABLET BY MOUTH ONCE DAILY BEFORE BREAKFAST, Disp: 90 tablet, Rfl: 3 .  lovastatin (MEVACOR) 10 MG tablet, Take 1 tablet (10 mg total) by mouth at bedtime., Disp: 90 tablet, Rfl: 3 .  Multiple Vitamin (MULTIVITAMIN WITH MINERALS) TABS, Take 1 tablet by mouth daily., Disp: , Rfl:   Allergies  Allergen Reactions  . Pravachol [Pravastatin Sodium]     Fatigue, weight loss     Review of Systems Constitutional: -fever, -chills, -sweats, -unexpected weight change, -decreased appetite, -fatigue Allergy: -sneezing, -itching, -congestion Dermatology: -changing moles, --rash, -lumps ENT: -runny nose, -ear pain, -sore throat, -hoarseness, -sinus pain, -teeth pain, - ringing in ears, -hearing loss, -nosebleeds Cardiology: -chest pain, -palpitations, -swelling, -difficulty breathing when lying flat, -waking up short of breath Respiratory: -cough, -shortness of breath, -difficulty breathing with exercise or exertion, -wheezing, -coughing up blood Gastroenterology: -abdominal pain, -nausea, -vomiting, -diarrhea, -constipation, -blood in stool, -changes in bowel movement, -difficulty swallowing or eating Hematology: -bleeding, -bruising  Musculoskeletal: -joint aches, -muscle aches, -joint swelling, -back pain, -neck pain, -cramping, -changes in gait Ophthalmology: denies vision changes, eye redness, itching, discharge Urology: -burning with urination, -difficulty urinating, -blood in urine, -urinary frequency, -urgency, -incontinence Neurology: -headache, -weakness, -tingling, -numbness, -memory loss, -falls, -dizziness Psychology: -depressed mood, -agitation, -sleep problems Breast/gyn: -breast tenderness, -discharge, -lumps, -vaginal  discharge,- irregular periods, -heavy periods     Objective:  BP 112/70   Pulse 60   Temp 98.6 F (37 C) (Oral)   Resp 16   Ht 5\' 5"  (1.651 m)   Wt 125 lb (56.7 kg)   SpO2 98%   BMI 20.80 kg/m    General appearance: alert, no distress, WD/WN, lean white female  Skin: right preauricular region with 2 separate raised flesh colored 48mm papules, left ear tragus with vertical fleshy appendage skin that she notes has been unchanged for  years, solar keratosis of bilat forearms and hands , scattered cherry hemangiomas of torso HEENT: normocephalic, conjunctiva/corneas normal, sclerae anicteric, PERRLA, EOMi, nares patent, no discharge or erythema, pharynx normal  Oral cavity: MMM, tongue normal, teeth in good repair  Neck: supple, no lymphadenopathy, no thyromegaly, no masses, normal ROM, no bruits  Chest: non tender, normal shape and expansion  Heart: RRR, normal S1, S2, no murmurs  Lungs: CTA bilaterally, no wheezes, rhonchi, or rales  Abdomen: +bs, soft, non tender, non distended, no masses, no hepatomegaly, no splenomegaly, faint bruit over mid aorta of abdomen Back: non tender, normal ROM, no scoliosis  Musculoskeletal: mild bony arthritic changes of bilat 5th finger PIP, right 4th toe MTP with bony arthritis change, right MCP of thumb with bony arthritic change, otherwise upper extremities non tender, no obvious deformity, normal ROM throughout, lower extremities non tender, no obvious deformity, normal ROM throughout  Extremities: no edema, no cyanosis, no clubbing  Pulses: 1+ symmetric, upper  extremities, normal cap refill , pedal pulses 1+ Neurological: alert, oriented x 3, CN2-12 intact, strength normal upper extremities and lower extremities, sensation normal throughout, DTRs 2+ throughout, no cerebellar signs, gait normal  Psychiatric: normal affect, behavior normal, pleasant  Breast, gyn, rectal - declined today   EKG  EKG rate 61 bpm, PR interval 154 ms, QRS 124 ms, QTC 481  ms, axis 104 degrees,, normal sinus rhythm, right bundle branch block   Assessment and Plan :   Encounter Diagnoses  Name Primary?  . Encounter for health maintenance examination in adult Yes  . Family history of premature coronary heart disease   . Other specified hypothyroidism   . History of Graves' disease   . Tobacco use disorder   . Atherosclerosis of aorta ([place])   . RBBB   . Abdominal bruit   . Post-menopausal   . Hyperlipidemia, unspecified hyperlipidemia type   . Osteopenia, unspecified location     Physical exam - discussed and counseled on healthy lifestyle, diet, exercise, preventative care, vaccinations, sick and well care, proper use of emergency dept and after hours care, and addressed their concerns.    Health screening: Advised they see their eye doctor yearly for routine vision care. Advised they see their dentist yearly for routine dental care including hygiene visits twice yearly.  Cancer screening Counseled on self breast exams, mammograms, cervical cancer screening  Colonoscopy:  Up to date, normal 11/2017 Mammogram from 2020 reviewed S/p hysterectomy, pelvic exam periodically, done 2019, declined today C/t self breast exams  Vaccinations: Advised yearly influenza vaccine  Counseled on the influenza virus vaccine.  Vaccine information sheet given.  Influenza vaccine given after consent obtained. Up to date on Shingrix, Tdap Due for Prevnar 13 in 05/2019   Acute issues discussed: none  Separate significant chronic issues discussed: We discussed her overall risk factors for heart disease-I would like to refer her to cardiology for some updated evaluation which could include stress test versus coronary CT.  She will let me know if agreeable.  She has a history of atherosclerosis, has a known abdominal aortic bruit with prior ultrasound showing significant atherosclerosis of the abdominal aorta, she continues to smoke, has premature family history of  coronary artery disease.  She is compliant with her statin  Hypothyroidism-continue current medication, labs today  Osteopenia, postmenopausal-reviewed the recent bone density scan.  Tobacco use-advised cessation.  She does not appear ready to quit  Hyperlipidemia-continue current medication    Jamariah was seen today for cpe.  Diagnoses  and all orders for this visit:  Encounter for health maintenance examination in adult -     Comprehensive metabolic panel -     CBC with Differential/Platelet -     VITAMIN D 25 Hydroxy (Vit-D Deficiency, Fractures) -     Lipid panel -     TSH -     T4, free  Family history of premature coronary heart disease  Other specified hypothyroidism -     TSH -     T4, free  History of Graves' disease  Tobacco use disorder  Atherosclerosis of aorta (HCC) -     Lipid panel  RBBB  Abdominal bruit  Post-menopausal  Hyperlipidemia, unspecified hyperlipidemia type  Osteopenia, unspecified location    Follow-up pending labs, yearly for physical

## 2018-11-20 LAB — LIPID PANEL
Chol/HDL Ratio: 1.9 ratio (ref 0.0–4.4)
Cholesterol, Total: 151 mg/dL (ref 100–199)
HDL: 80 mg/dL (ref >39)
LDL Calculated: 62 mg/dL (ref 0–99)
Triglycerides: 47 mg/dL (ref 0–149)
VLDL Cholesterol Cal: 9 mg/dL (ref 5–40)

## 2018-11-20 LAB — CBC WITH DIFFERENTIAL/PLATELET
Basophils Absolute: 0.1 10*3/uL (ref 0.0–0.2)
Basos: 2 %
EOS (ABSOLUTE): 0.2 10*3/uL (ref 0.0–0.4)
Eos: 2 %
Hematocrit: 40.8 % (ref 34.0–46.6)
Hemoglobin: 13.8 g/dL (ref 11.1–15.9)
Immature Grans (Abs): 0 10*3/uL (ref 0.0–0.1)
Immature Granulocytes: 0 %
Lymphocytes Absolute: 1.6 10*3/uL (ref 0.7–3.1)
Lymphs: 26 %
MCH: 32.6 pg (ref 26.6–33.0)
MCHC: 33.8 g/dL (ref 31.5–35.7)
MCV: 97 fL (ref 79–97)
Monocytes Absolute: 0.5 10*3/uL (ref 0.1–0.9)
Monocytes: 8 %
Neutrophils Absolute: 4 10*3/uL (ref 1.4–7.0)
Neutrophils: 62 %
Platelets: 276 10*3/uL (ref 150–450)
RBC: 4.23 x10E6/uL (ref 3.77–5.28)
RDW: 12.2 % (ref 11.7–15.4)
WBC: 6.3 10*3/uL (ref 3.4–10.8)

## 2018-11-20 LAB — COMPREHENSIVE METABOLIC PANEL
ALT: 8 IU/L (ref 0–32)
AST: 13 IU/L (ref 0–40)
Albumin/Globulin Ratio: 2.6 — ABNORMAL HIGH (ref 1.2–2.2)
Albumin: 4.2 g/dL (ref 3.8–4.8)
Alkaline Phosphatase: 40 IU/L (ref 39–117)
BUN/Creatinine Ratio: 14 (ref 12–28)
BUN: 12 mg/dL (ref 8–27)
Bilirubin Total: 0.3 mg/dL (ref 0.0–1.2)
CO2: 22 mmol/L (ref 20–29)
Calcium: 9.3 mg/dL (ref 8.7–10.3)
Chloride: 103 mmol/L (ref 96–106)
Creatinine, Ser: 0.83 mg/dL (ref 0.57–1.00)
GFR calc Af Amer: 86 mL/min/{1.73_m2} (ref >59)
GFR calc non Af Amer: 75 mL/min/{1.73_m2} (ref >59)
Globulin, Total: 1.6 g/dL (ref 1.5–4.5)
Glucose: 99 mg/dL (ref 65–99)
Potassium: 4.4 mmol/L (ref 3.5–5.2)
Sodium: 142 mmol/L (ref 134–144)
Total Protein: 5.8 g/dL — ABNORMAL LOW (ref 6.0–8.5)

## 2018-11-20 LAB — VITAMIN D 25 HYDROXY (VIT D DEFICIENCY, FRACTURES): Vit D, 25-Hydroxy: 21.5 ng/mL — ABNORMAL LOW (ref 30.0–100.0)

## 2018-11-20 LAB — T4, FREE: Free T4: 1.44 ng/dL (ref 0.82–1.77)

## 2018-11-20 LAB — TSH: TSH: 7.52 u[IU]/mL — ABNORMAL HIGH (ref 0.450–4.500)

## 2018-11-21 ENCOUNTER — Other Ambulatory Visit: Payer: Self-pay

## 2018-11-21 DIAGNOSIS — F17200 Nicotine dependence, unspecified, uncomplicated: Secondary | ICD-10-CM

## 2018-11-21 DIAGNOSIS — Z8249 Family history of ischemic heart disease and other diseases of the circulatory system: Secondary | ICD-10-CM

## 2018-11-21 DIAGNOSIS — I700 Atherosclerosis of aorta: Secondary | ICD-10-CM

## 2018-11-21 NOTE — Addendum Note (Signed)
Addended by: Gwinda Maine on: 11/21/2018 12:03 PM   Modules accepted: Orders

## 2018-11-21 NOTE — Telephone Encounter (Signed)
Faxed note and 2015 bone density [facility]

## 2018-11-24 ENCOUNTER — Other Ambulatory Visit: Payer: Self-pay | Admitting: Medical

## 2018-11-24 MED ORDER — ASPIRIN EC 81 MG PO TBEC: 81.0000 mg | DELAYED_RELEASE_TABLET | Freq: Every day | ORAL | 3 refills | Status: DC

## 2018-11-24 MED ORDER — VITAMIN D 25 MCG (1000 UNIT) PO TABS: 1000.0000 [IU] | ORAL_TABLET | Freq: Every day | ORAL | 3 refills | Status: DC

## 2018-11-24 MED ORDER — LOVASTATIN 10 MG PO TABS: 10.0000 mg | ORAL_TABLET | Freq: Every day | ORAL | 3 refills | Status: DC

## 2018-11-24 MED ORDER — LEVOTHYROXINE SODIUM 112 MCG PO TABS: 112.0000 ug | ORAL_TABLET | Freq: Every day | ORAL | 2 refills | Status: DC

## 2018-11-24 NOTE — Progress Notes (Signed)
Error

## 2018-12-15 DIAGNOSIS — E559 Vitamin D deficiency, unspecified: Secondary | ICD-10-CM | POA: Insufficient documentation

## 2018-12-17 ENCOUNTER — Other Ambulatory Visit: Payer: Self-pay

## 2018-12-17 ENCOUNTER — Encounter: Payer: Self-pay | Admitting: Cardiology

## 2018-12-17 ENCOUNTER — Ambulatory Visit (INDEPENDENT_AMBULATORY_CARE_PROVIDER_SITE_OTHER): Payer: Managed Care, Other (non HMO) | Admitting: Cardiology

## 2018-12-17 VITALS — BP 161/75 | HR 65 | Ht 64.0 in | Wt 124.0 lb

## 2018-12-17 DIAGNOSIS — E782 Mixed hyperlipidemia: Secondary | ICD-10-CM | POA: Diagnosis not present

## 2018-12-17 DIAGNOSIS — I700 Atherosclerosis of aorta: Secondary | ICD-10-CM

## 2018-12-17 DIAGNOSIS — I739 Peripheral vascular disease, unspecified: Secondary | ICD-10-CM

## 2018-12-17 DIAGNOSIS — Z8249 Family history of ischemic heart disease and other diseases of the circulatory system: Secondary | ICD-10-CM | POA: Diagnosis not present

## 2018-12-17 DIAGNOSIS — R9431 Abnormal electrocardiogram [ECG] [EKG]: Secondary | ICD-10-CM

## 2018-12-17 DIAGNOSIS — Z720 Tobacco use: Secondary | ICD-10-CM

## 2018-12-17 MED ORDER — OLMESARTAN MEDOXOMIL-HCTZ 20-12.5 MG PO TABS: 1.0000 | ORAL_TABLET | Freq: Every day | ORAL | 1 refills | Status: DC

## 2018-12-17 NOTE — Progress Notes (Signed)
Primary Physician:  Carlena Hurl, PA-C   [name], female    DOB: 1955/11/11, 63 y.o.   MRN: [id number]  Subjective:    Chief Complaint  Patient presents with  . Hyperlipidemia  . New Patient (Initial Visit)    HPI: Jodi Flynn  is a 63 y.o. female  with hypothyroidism, hyperlipidemia, family history of early CAD, right bundle branch block, known abdominal aortic atherosclerosis, ongoing tobacco use, referred to [place] for cardiac risk stratification and abnormal EKG.  She has noticed recently elevations in her blood pressure. She has some mild shortness of breath with activities, but not overtly bothered by this. Denies any chest pain. She does notice pain in her left hip with walking and thinks she may have arthritis. No PND, orthopnea, or leg swelling.   Hyperlipidemia is well controlled with medications. Denies any history of hypertension.   She is mostly sedentary. She works as Chiropractor. She is a half pack per day smoker.  Past Medical History:  Diagnosis Date  . Allergy   . Family history of premature coronary heart disease   . Grave's disease   . History of cervical cancer    s/p hysterectomy  . History of renal stone   . Hx of colonic polyps 2007  . Osteopenia    bone density scans 2015, 2020  . Tobacco use disorder   . Wears glasses     Past Surgical History:  Procedure Laterality Date  . ABDOMINAL HYSTERECTOMY     ovaries intact; age 56yo  . COLONOSCOPY  age 30, 12/12/05   Dr. Verl Blalock  . COLONOSCOPY  11/2017   normal; Dr. Wilfrid Lund  . COLPOSCOPY     age 63  . VEIN SURGERY  1/12   laser ablation left great saphenous vein , stab phlebectomy of multiple varicosities, Dr. Donnetta Hutching     Social History   Socioeconomic History  . Marital status: Widowed    Spouse name: Not on file  . Number of children: 2  . Years of education: Not on file  . Highest education level: Not on file  Occupational History  . Occupation:  Engineer, drilling for [employer]    Employer: [employer]  . Financial resource strain: Not on file  . Food insecurity    Worry: Not on file    Inability: Not on file  . Transportation needs    Medical: Not on file    Non-medical: Not on file  Tobacco Use  . Smoking status: Current Some Day Smoker    Packs/day: 0.50    Years: 42.00    Pack years: 21.00    Types: Cigarettes  . Smokeless tobacco: Never Used  Substance and Sexual Activity  . Alcohol use: Not Currently    Alcohol/week: 7.0 standard drinks    Types: 5 Glasses of wine, 2 Standard drinks or equivalent per week  . Drug use: No  . Sexual activity: Yes  Lifestyle  . Physical activity    Days per week: Not on file    Minutes per session: Not on file  . Stress: Not on file  Relationships  . Social Herbalist on phone: Not on file    Gets together: Not on file    Attends religious service: Not on file    Active member of club or organization: Not on file    Attends meetings of clubs or organizations: Not on file  Relationship status: Not on file  . Intimate partner violence    Fear of current or ex partner: Not on file    Emotionally abused: Not on file    Physically abused: Not on file    Forced sexual activity: Not on file  Other Topics Concern  . Not on file  Social History Narrative   Widowed in 2010. 2 children, walks for exercise, hobbies: puzzles.  Boyfriend of 2 years committed suicide (in front of her) 10/2018    Review of Systems  Constitution: Negative for decreased appetite, malaise/fatigue, weight gain and weight loss.  Eyes: Negative for visual disturbance.  Cardiovascular: Positive for claudication and dyspnea on exertion. Negative for chest pain, leg swelling, orthopnea, palpitations and syncope.  Respiratory: Negative for hemoptysis and wheezing.   Endocrine: Negative for cold intolerance and heat intolerance.  Hematologic/Lymphatic: Does not bruise/bleed easily.   Skin: Negative for nail changes.  Musculoskeletal: Negative for muscle weakness and myalgias.  Gastrointestinal: Negative for abdominal pain, change in bowel habit, nausea and vomiting.  Neurological: Negative for difficulty with concentration, dizziness, focal weakness and headaches.  Psychiatric/Behavioral: Negative for altered mental status and suicidal ideas.  All other systems reviewed and are negative.     Objective:  Blood pressure (!) 161/75, pulse 65, height 5\' 4"  (1.626 m), weight 124 lb (56.2 kg), SpO2 99 %. Body mass index is 21.28 kg/m.    Physical Exam  Constitutional: She is oriented to person, place, and time. Vital signs are normal. She appears well-developed and well-nourished.  HENT:  Head: Normocephalic and atraumatic.  Neck: Normal range of motion.  Cardiovascular: Normal rate, regular rhythm, normal heart sounds and intact distal pulses.  Pulses:      Femoral pulses are 2+ on the right side and 1+ on the left side.      Popliteal pulses are 2+ on the right side and 1+ on the left side.       Dorsalis pedis pulses are 2+ on the right side and 0 on the left side.       Posterior tibial pulses are 0 on the right side and 0 on the left side.  Pulmonary/Chest: Effort normal and breath sounds normal. No accessory muscle usage. No respiratory distress.  Abdominal: Soft. Bowel sounds are normal. She exhibits abdominal bruit.  Musculoskeletal: Normal range of motion.  Neurological: She is alert and oriented to person, place, and time.  Skin: Skin is warm and dry.  Vitals reviewed.  Radiology: No results found.  Laboratory examination:    CMP Latest Ref Rng & Units 11/19/2018 11/18/2017 08/09/2016  Glucose 65 - 99 mg/dL 99 101(H) -  BUN 8 - 27 mg/dL 12 19 -  Creatinine 0.57 - 1.00 mg/dL 0.83 0.80 -  Sodium 134 - 144 mmol/L 142 142 -  Potassium 3.5 - 5.2 mmol/L 4.4 4.9 -  Chloride 96 - 106 mmol/L 103 102 -  CO2 20 - 29 mmol/L 22 25 -  Calcium 8.7 - 10.3 mg/dL 9.3  9.6 -  Total Protein 6.0 - 8.5 g/dL 5.8(L) 6.1 -  Total Bilirubin 0.0 - 1.2 mg/dL 0.3 0.4 -  Alkaline Phos 39 - 117 IU/L 40 36(L) -  AST 0 - 40 IU/L 13 17 -  ALT 0 - 32 IU/L 8 15 9    CBC Latest Ref Rng & Units 11/19/2018 11/18/2017 10/11/2016  WBC 3.4 - 10.8 x10E3/uL 6.3 7.7 5.7  Hemoglobin 11.1 - 15.9 g/dL 13.8 14.0 13.3  Hematocrit 34.0 - 46.6 %  40.8 42.2 40.6  Platelets 150 - 450 x10E3/uL 276 314 289   Lipid Panel     Component Value Date/Time   CHOL 151 11/19/2018 0905   TRIG 47 11/19/2018 0905   HDL 80 11/19/2018 0905   CHOLHDL 1.9 11/19/2018 0905   CHOLHDL 2.2 08/09/2016 0729   VLDL 19 08/09/2016 0729   LDLCALC 62 11/19/2018 0905   HEMOGLOBIN A1C No results found for: HGBA1C, MPG TSH Recent Labs    11/19/18 0905  TSH 7.520*    PRN Meds:. Medications Discontinued During This Encounter  Medication Reason  . levothyroxine (SYNTHROID, LEVOTHROID) 100 MCG tablet Error   Current Meds  Medication Sig  . aspirin EC 81 MG tablet Take 1 tablet (81 mg total) by mouth daily.  . cholecalciferol (VITAMIN D3) 25 MCG (1000 UT) tablet Take 1 tablet (1,000 Units total) by mouth daily.  . Cyanocobalamin (VITAMIN B 12 PO) Take by mouth.  . fish oil-omega-3 fatty acids 1000 MG capsule Take 2 g by mouth daily.   Marland Kitchen levothyroxine (SYNTHROID) 112 MCG tablet Take 1 tablet (112 mcg total) by mouth daily.  Marland Kitchen lovastatin (MEVACOR) 10 MG tablet Take 1 tablet (10 mg total) by mouth at bedtime.  . Multiple Vitamin (MULTIVITAMIN WITH MINERALS) TABS Take 1 tablet by mouth daily.    Cardiac Studies:   Abd aortic US 06/05/2016:  Normal tapering caliber of the abdominal aorta. Significant atherosclerotic mural plaque.  Assessment:     ICD-10-CM   1. Family history of premature coronary heart disease  Z82.49 EKG 12-Lead  2. Abdominal aortic atherosclerosis (HCC)  I70.0 EKG 12-Lead  3. Tobacco use  Z72.0 EKG 12-Lead  4. Mixed hyperlipidemia  E78.2 EKG 12-Lead    EKG 12/17/2018: Normal  sinus rhythm at 73 bpm, rightward axis, IRBBB. No evidence of ischemia.   Recommendations:   Patient with family history of early CAD, abdominal aortic atherosclerosis, tobacco use, mixed hyperlipidemia, referred to [place] for cardiac risk trifurcation and abnormal EKG.  Patient is without symptoms of chest pain.  She does have mild dyspnea on exertion that has been present for several years, has not worsened.  She is mostly sedentary therefore, functional capacity is limited.  She does have mildly abnormal EKG with incomplete right bundle branch block that may also be attributed by underlying hypertension.  Given her significant risk factors, she will need cardiac evaluation, will obtain exercise nuclear stress testing.  We will also recommend echocardiogram to exclude any structural abnormalities.  Her blood pressure is markedly elevated today and she has noticed some elevations in her blood pressure at home.  We will start her on olmesartan hydrochlorothiazide.  She does have significant abdominal aortic atherosclerosis and abdominal aortic bruit on physical exam.  Vascular exam on her left lower extremity is abnormal and suspect her hip pain with walking is related to claudication.  She will need PAD evaluation, will obtain lower extremity arterial duplex for surveillance.  I have extensively discussed with the patient the importance of quitting smoking.  She is not interested in medications at this time, encouraged her to try to use Nicorette gum or patches.  We will continue to monitor her progress.  I will see her back after the test for follow-up.  Miquel Dunn, MSN, APRN, FNP-C [facility]. [place] Office: [phone] Fax: [phone]

## 2018-12-18 ENCOUNTER — Encounter: Payer: Self-pay | Admitting: Cardiology

## 2019-01-02 ENCOUNTER — Other Ambulatory Visit: Payer: Self-pay | Admitting: Cardiology

## 2019-01-02 DIAGNOSIS — I700 Atherosclerosis of aorta: Secondary | ICD-10-CM

## 2019-01-03 LAB — BASIC METABOLIC PANEL
BUN/Creatinine Ratio: 21 (ref 12–28)
BUN: 18 mg/dL (ref 8–27)
CO2: 24 mmol/L (ref 20–29)
Calcium: 9.6 mg/dL (ref 8.7–10.3)
Chloride: 103 mmol/L (ref 96–106)
Creatinine, Ser: 0.84 mg/dL (ref 0.57–1.00)
GFR calc Af Amer: 85 mL/min/{1.73_m2} (ref >59)
GFR calc non Af Amer: 74 mL/min/{1.73_m2} (ref >59)
Glucose: 98 mg/dL (ref 65–99)
Potassium: 4.6 mmol/L (ref 3.5–5.2)
Sodium: 141 mmol/L (ref 134–144)

## 2019-01-09 ENCOUNTER — Other Ambulatory Visit: Payer: Self-pay

## 2019-01-09 ENCOUNTER — Ambulatory Visit (INDEPENDENT_AMBULATORY_CARE_PROVIDER_SITE_OTHER): Payer: Managed Care, Other (non HMO)

## 2019-01-09 DIAGNOSIS — I739 Peripheral vascular disease, unspecified: Secondary | ICD-10-CM | POA: Diagnosis not present

## 2019-01-09 DIAGNOSIS — R9431 Abnormal electrocardiogram [ECG] [EKG]: Secondary | ICD-10-CM | POA: Diagnosis not present

## 2019-01-14 ENCOUNTER — Encounter: Payer: Self-pay | Admitting: Medical

## 2019-01-19 ENCOUNTER — Ambulatory Visit (INDEPENDENT_AMBULATORY_CARE_PROVIDER_SITE_OTHER): Payer: Managed Care, Other (non HMO)

## 2019-01-19 ENCOUNTER — Other Ambulatory Visit: Payer: Self-pay

## 2019-01-19 DIAGNOSIS — I700 Atherosclerosis of aorta: Secondary | ICD-10-CM

## 2019-01-19 DIAGNOSIS — R9431 Abnormal electrocardiogram [ECG] [EKG]: Secondary | ICD-10-CM | POA: Diagnosis not present

## 2019-01-22 ENCOUNTER — Encounter: Payer: Self-pay | Admitting: Medical

## 2019-01-23 ENCOUNTER — Encounter: Payer: Self-pay | Admitting: Cardiology

## 2019-01-23 ENCOUNTER — Other Ambulatory Visit: Payer: Self-pay

## 2019-01-23 ENCOUNTER — Ambulatory Visit (INDEPENDENT_AMBULATORY_CARE_PROVIDER_SITE_OTHER): Payer: Managed Care, Other (non HMO) | Admitting: Cardiology

## 2019-01-23 VITALS — BP 125/54 | HR 75 | Ht 65.0 in | Wt 127.4 lb

## 2019-01-23 DIAGNOSIS — R9431 Abnormal electrocardiogram [ECG] [EKG]: Secondary | ICD-10-CM

## 2019-01-23 DIAGNOSIS — Z720 Tobacco use: Secondary | ICD-10-CM | POA: Diagnosis not present

## 2019-01-23 DIAGNOSIS — E782 Mixed hyperlipidemia: Secondary | ICD-10-CM | POA: Diagnosis not present

## 2019-01-23 DIAGNOSIS — I700 Atherosclerosis of aorta: Secondary | ICD-10-CM | POA: Diagnosis not present

## 2019-01-23 NOTE — Progress Notes (Signed)
Primary Physician:  Carlena Hurl, PA-C   [name], female    DOB: May 31, 1955, 63 y.o.   MRN: [id number]  Subjective:    Chief Complaint  Patient presents with  . Hypertension  . Follow-up    HPI: Jodi Flynn  is a 63 y.o. female  with hypothyroidism, hyperlipidemia, family history of early CAD, right bundle branch block, known abdominal aortic atherosclerosis, ongoing tobacco use, recently evaluated by [place] for abnormal EKG.  Due to her risk factors, underwent echo and stress testing. She also had significant abdominal aortic atherosclerosis and abdominal aortic bruit on physical exam, and underwent LE arterial duplex. She now presents for follow up.   She is presently doing well without any new complaints.  Has mild claudication symptoms in her left thigh particularly with walking upstairs.  Mild dyspnea on exertion.  No chest pain.  No PND, orthopnea, or leg swelling.  Blood pressure has improved with olmesartan hydrochlorothiazide.  Hyperlipidemia is well controlled with medications.   She is mostly sedentary. She works as Chiropractor. She has cut back to 6-7 cigarettes per day.   Past Medical History:  Diagnosis Date  . Allergy   . Family history of premature coronary heart disease   . Grave's disease   . History of cervical cancer    s/p hysterectomy  . History of renal stone   . Hx of colonic polyps 2007  . Osteopenia    bone density scans 2015, 2020  . Tobacco use disorder   . Wears glasses     Past Surgical History:  Procedure Laterality Date  . ABDOMINAL HYSTERECTOMY     ovaries intact; age 62yo  . COLONOSCOPY  age 61, 12/12/05   Dr. Verl Blalock  . COLONOSCOPY  11/2017   normal; Dr. Wilfrid Lund  . COLPOSCOPY     age 68  . VEIN SURGERY  1/12   laser ablation left great saphenous vein , stab phlebectomy of multiple varicosities, Dr. Donnetta Hutching     Social History   Socioeconomic History  . Marital status: Widowed    Spouse  name: Not on file  . Number of children: 2  . Years of education: Not on file  . Highest education level: Not on file  Occupational History  . Occupation: Engineer, drilling for [employer]    Employer: [employer]  . Financial resource strain: Not on file  . Food insecurity    Worry: Not on file    Inability: Not on file  . Transportation needs    Medical: Not on file    Non-medical: Not on file  Tobacco Use  . Smoking status: Current Some Day Smoker    Packs/day: 0.50    Years: 42.00    Pack years: 21.00    Types: Cigarettes  . Smokeless tobacco: Never Used  Substance and Sexual Activity  . Alcohol use: Not Currently    Alcohol/week: 7.0 standard drinks    Types: 5 Glasses of wine, 2 Standard drinks or equivalent per week  . Drug use: No  . Sexual activity: Yes  Lifestyle  . Physical activity    Days per week: Not on file    Minutes per session: Not on file  . Stress: Not on file  Relationships  . Social Herbalist on phone: Not on file    Gets together: Not on file    Attends religious service: Not on file  Active member of club or organization: Not on file    Attends meetings of clubs or organizations: Not on file    Relationship status: Not on file  . Intimate partner violence    Fear of current or ex partner: Not on file    Emotionally abused: Not on file    Physically abused: Not on file    Forced sexual activity: Not on file  Other Topics Concern  . Not on file  Social History Narrative   Widowed in 2010. 2 children, walks for exercise, hobbies: puzzles.  Boyfriend of 2 years committed suicide (in front of her) 10/2018    Review of Systems  Constitution: Negative for decreased appetite, malaise/fatigue, weight gain and weight loss.  Eyes: Negative for visual disturbance.  Cardiovascular: Positive for claudication and dyspnea on exertion. Negative for chest pain, leg swelling, orthopnea, palpitations and syncope.  Respiratory:  Negative for hemoptysis and wheezing.   Endocrine: Negative for cold intolerance and heat intolerance.  Hematologic/Lymphatic: Does not bruise/bleed easily.  Skin: Negative for nail changes.  Musculoskeletal: Negative for muscle weakness and myalgias.  Gastrointestinal: Negative for abdominal pain, change in bowel habit, nausea and vomiting.  Neurological: Negative for difficulty with concentration, dizziness, focal weakness and headaches.  Psychiatric/Behavioral: Negative for altered mental status and suicidal ideas.  All other systems reviewed and are negative.     Objective:  Blood pressure (!) 125/54, pulse 75, height 5\' 5"  (1.651 m), weight 127 lb 6.4 oz (57.8 kg), SpO2 94 %. Body mass index is 21.2 kg/m.    Physical Exam  Constitutional: She is oriented to person, place, and time. Vital signs are normal. She appears well-developed and well-nourished.  HENT:  Head: Normocephalic and atraumatic.  Neck: Normal range of motion.  Cardiovascular: Normal rate, regular rhythm, normal heart sounds and intact distal pulses.  Pulses:      Femoral pulses are 2+ on the right side and 1+ on the left side.      Popliteal pulses are 2+ on the right side and 1+ on the left side.       Dorsalis pedis pulses are 2+ on the right side and 0 on the left side.       Posterior tibial pulses are 0 on the right side and 0 on the left side.  Pulmonary/Chest: Effort normal and breath sounds normal. No accessory muscle usage. No respiratory distress.  Abdominal: Soft. Bowel sounds are normal. She exhibits abdominal bruit.  Musculoskeletal: Normal range of motion.  Neurological: She is alert and oriented to person, place, and time.  Skin: Skin is warm and dry.  Vitals reviewed.  Radiology: No results found.  Laboratory examination:    CMP Latest Ref Rng & Units 01/02/2019 11/19/2018 11/18/2017  Glucose 65 - 99 mg/dL 98 99 101(H)  BUN 8 - 27 mg/dL 18 12 19   Creatinine 0.57 - 1.00 mg/dL 0.84 0.83 0.80   Sodium 134 - 144 mmol/L 141 142 142  Potassium 3.5 - 5.2 mmol/L 4.6 4.4 4.9  Chloride 96 - 106 mmol/L 103 103 102  CO2 20 - 29 mmol/L 24 22 25   Calcium 8.7 - 10.3 mg/dL 9.6 9.3 9.6  Total Protein 6.0 - 8.5 g/dL - 5.8(L) 6.1  Total Bilirubin 0.0 - 1.2 mg/dL - 0.3 0.4  Alkaline Phos 39 - 117 IU/L - 40 36(L)  AST 0 - 40 IU/L - 13 17  ALT 0 - 32 IU/L - 8 15   CBC Latest Ref Rng & Units  11/19/2018 11/18/2017 10/11/2016  WBC 3.4 - 10.8 x10E3/uL 6.3 7.7 5.7  Hemoglobin 11.1 - 15.9 g/dL 13.8 14.0 13.3  Hematocrit 34.0 - 46.6 % 40.8 42.2 40.6  Platelets 150 - 450 x10E3/uL 276 314 289   Lipid Panel     Component Value Date/Time   CHOL 151 11/19/2018 0905   TRIG 47 11/19/2018 0905   HDL 80 11/19/2018 0905   CHOLHDL 1.9 11/19/2018 0905   CHOLHDL 2.2 08/09/2016 0729   VLDL 19 08/09/2016 0729   LDLCALC 62 11/19/2018 0905   HEMOGLOBIN A1C No results found for: HGBA1C, MPG TSH Recent Labs    11/19/18 0905  TSH 7.520*    PRN Meds:. There are no discontinued medications. Current Meds  Medication Sig  . aspirin EC 81 MG tablet Take 1 tablet (81 mg total) by mouth daily.  . cholecalciferol (VITAMIN D3) 25 MCG (1000 UT) tablet Take 1 tablet (1,000 Units total) by mouth daily.  . Cyanocobalamin (VITAMIN B 12 PO) Take by mouth.  . fish oil-omega-3 fatty acids 1000 MG capsule Take 2 g by mouth daily.   Marland Kitchen levothyroxine (SYNTHROID) 112 MCG tablet Take 1 tablet (112 mcg total) by mouth daily.  Marland Kitchen lovastatin (MEVACOR) 10 MG tablet Take 1 tablet (10 mg total) by mouth at bedtime.  . Multiple Vitamin (MULTIVITAMIN WITH MINERALS) TABS Take 1 tablet by mouth daily.  . nicotine (NICODERM CQ - DOSED IN MG/24 HR) 7 mg/24hr patch Place 7 mg onto the skin daily.  Marland Kitchen olmesartan-hydrochlorothiazide (BENICAR HCT) 20-12.5 MG tablet Take 1 tablet by mouth daily.    Cardiac Studies:   Echocardiogram 01/09/2019: Left ventricle cavity is normal in size. Normal left ventricular wall thickness. Normal LV systolic  function with EF 55%. Normal global wall motion. Normal diastolic filling pattern. Calculated EF 55%. Left atrial cavity is normal in size. Mild (Grade I) mitral regurgitation. Mild tricuspid regurgitation.  No evidence of pulmonary hypertension.  Lower Extremity Arterial Duplex 01/09/2019: No hemodynamically significant stenosis are identified in the lower extremity arterial system.  This exam reveals normal perfusion of the right lower extremity (ABI 1.05).\ and mildly decreased perfusion of the left lower extremity, noted at the anterior tibial and post tibial artery level (ABI 0.95).   Exercise Sestamibi Stress Test 01/19/2019: 1. Resting EKG, normal sinus rhythm, right bundle branch block.  Stress EKG is negative for myocardial ischemia.  Patient exercised for 5:44 min, achieved 7.05 mets.  Normal blood pressure response, stress terminated due to achieving 103% of MPHR. 2. Myocardial perfusion imaging is normal. Left ventricular ejection fraction is  79% with normal wall motion. Low risk study. 3. Low risk stress test. No previous exam available for comparison.  Abd aortic US 06/05/2016:  Normal tapering caliber of the abdominal aorta. Significant atherosclerotic mural plaque.  Assessment:     ICD-10-CM   1. Abnormal EKG  R94.31   2. Abdominal aortic atherosclerosis (HCC)  I70.0   3. Tobacco use  Z72.0   4. Mixed hyperlipidemia  E78.2     EKG 12/17/2018: Normal sinus rhythm at 73 bpm, rightward axis, IRBBB. No evidence of ischemia.   Recommendations:   Patient with family history of early CAD, abdominal aortic atherosclerosis, tobacco use, mixed hyperlipidemia, referred to [place] for cardiac risk trifurcation and abnormal EKG.  I discussed recently obtained exercise nuclear stress testing, echocardiogram, lower extremity arterial duplex.  No evidence of perfusion abnormalities.  Normal LVEF by echocardiogram.  No hemodynamically significant stenosis by extremity duplex and only  mildly  decreased ABI on the left side but she does have some mild claudication symptoms in her left thigh.  I would recommend continued aggressive medical management and primary and secondary prevention measures.  Blood pressure is now well controlled with olmesartan hydrochlorothiazide, will continue the same.  I have congratulated her on cutting back on cigarette use, and urged her to continue to use to help with completely quitting smoking.  Encouraged her to start regular exercise in view of low exercise or mild claudication symptoms will see her back in a as needed basis or to contact me for any worsening problems.  Miquel Dunn, MSN, APRN, FNP-C [facility]. [place] Office: [phone] Fax: [phone]

## 2019-02-08 ENCOUNTER — Other Ambulatory Visit: Payer: Self-pay | Admitting: Cardiology

## 2019-02-08 ENCOUNTER — Other Ambulatory Visit: Payer: Self-pay | Admitting: Medical

## 2019-02-11 ENCOUNTER — Other Ambulatory Visit: Payer: Self-pay

## 2019-02-11 ENCOUNTER — Encounter: Payer: Self-pay | Admitting: Medical

## 2019-02-11 ENCOUNTER — Ambulatory Visit (INDEPENDENT_AMBULATORY_CARE_PROVIDER_SITE_OTHER): Payer: Managed Care, Other (non HMO) | Admitting: Medical

## 2019-02-11 VITALS — BP 100/70 | HR 80 | Temp 98.0°F | Ht 65.0 in | Wt 127.6 lb

## 2019-02-11 DIAGNOSIS — M778 Other enthesopathies, not elsewhere classified: Secondary | ICD-10-CM

## 2019-02-11 DIAGNOSIS — M7072 Other bursitis of hip, left hip: Secondary | ICD-10-CM | POA: Diagnosis not present

## 2019-02-11 MED ORDER — NAPROXEN 375 MG PO TABS: 375.0000 mg | ORAL_TABLET | Freq: Two times a day (BID) | ORAL | 0 refills | Status: DC

## 2019-02-11 NOTE — Progress Notes (Signed)
Subjective: Chief Complaint  Patient presents with  . Hip Pain    left when stepping up and putting pressure on left leg   . Hand Pain    right thumb    Here for pains.  She notes for the last month or so having pain in her left lateral hip, per to be worse with stairs.  She goes up and down stairs at work and at home every day.  Lately even having to pick one leg up to get on the next step.  She denies fall or trauma or injury.  No numbness or tingling in the leg no swelling, no back pain, no buttock pain.  She notes pain in her right thumb.  She is left-handed.  She notes pain particularly with abduction and adduction of the thumb.  No injury or trauma.  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  No other aggravating relieving factors  No fever  No other aggravating or relieving factors. No other complaint.   Objective: BP 100/70   Pulse 80   Temp 98 F (36.7 C)   Ht 5\' 5"  (1.651 m)   Wt 127 lb 9.6 oz (57.9 kg)   SpO2 97%   BMI 21.23 kg/m   General: Well-developed, well-nourished no acute stress Skin: Unremarkable no redness no warmth MSK: Tender over the right thumb base in proximal phalanx, pain with abduction and adduction of the thumb both active and resisted, otherwise fingers and hands nontender no deformity no swelling.  Wrist also nontender and normal range of motion, fingers normal range of motion Tender over left trochanteric bursa, otherwise nontender, mild pain with hip range of motion but particular in the bursa region.  Rest of exam unremarkable Legs neurovascularly intact   Assessment: Encounter Diagnoses  Name Primary?  . Bursitis of left hip, unspecified bursa Yes  . Tendonitis of finger     Plan: We discussed her symptoms and findings.  Bursitis-begin Naprosyn, can use ice therapy 20 minutes on 20 minutes off, rest, limiting use of stairs if possible in the next week, stretching.  If worse or not improving within the next 1-2  week then recheck  Right thumb tendinitis-advised thumb spica splint over-the-counter, ice therapy, relative rest, and if not much improved within the next 1 to 2 weeks then recheck  She got her flu shot at work recently  Simranpreet was seen today for hip pain and hand pain.  Diagnoses and all orders for this visit:  Bursitis of left hip, unspecified bursa  Tendonitis of finger  Other orders -     naproxen (NAPROSYN) 375 MG tablet; Take 1 tablet (375 mg total) by mouth 2 (two) times daily with a meal.

## 2019-03-11 ENCOUNTER — Encounter: Payer: Self-pay | Admitting: Medical

## 2019-03-11 ENCOUNTER — Other Ambulatory Visit: Payer: Self-pay

## 2019-03-11 ENCOUNTER — Ambulatory Visit (INDEPENDENT_AMBULATORY_CARE_PROVIDER_SITE_OTHER): Payer: Managed Care, Other (non HMO) | Admitting: Medical

## 2019-03-11 VITALS — BP 108/70 | HR 68 | Temp 96.8°F | Ht 65.0 in | Wt 126.2 lb

## 2019-03-11 DIAGNOSIS — E785 Hyperlipidemia, unspecified: Secondary | ICD-10-CM

## 2019-03-11 DIAGNOSIS — Z8249 Family history of ischemic heart disease and other diseases of the circulatory system: Secondary | ICD-10-CM | POA: Insufficient documentation

## 2019-03-11 DIAGNOSIS — H93A1 Pulsatile tinnitus, right ear: Secondary | ICD-10-CM | POA: Diagnosis not present

## 2019-03-11 DIAGNOSIS — M7062 Trochanteric bursitis, left hip: Secondary | ICD-10-CM | POA: Diagnosis not present

## 2019-03-11 DIAGNOSIS — M7061 Trochanteric bursitis, right hip: Secondary | ICD-10-CM | POA: Diagnosis not present

## 2019-03-11 DIAGNOSIS — F17200 Nicotine dependence, unspecified, uncomplicated: Secondary | ICD-10-CM

## 2019-03-11 DIAGNOSIS — I700 Atherosclerosis of aorta: Secondary | ICD-10-CM | POA: Diagnosis not present

## 2019-03-11 MED ORDER — TRIAMCINOLONE ACETONIDE 40 MG/ML IJ SUSP
40.0000 mg | Freq: Once | INTRAMUSCULAR | Status: AC
  Administered 2019-03-11: 40 mg via INTRAMUSCULAR

## 2019-03-11 MED ORDER — LIDOCAINE HCL 1 % IJ SOLN: 5.0000 mL | Freq: Once | INTRAMUSCULAR | 0 refills | Status: DC

## 2019-03-11 MED ORDER — LIDOCAINE HCL (PF) 1 % IJ SOLN: 5.0000 mL | Freq: Once | INTRAMUSCULAR | Status: DC

## 2019-03-11 NOTE — Addendum Note (Signed)
Addended by: Edgar Frisk on: 03/11/2019 10:00 AM   Modules accepted: Orders

## 2019-03-11 NOTE — Progress Notes (Signed)
Subjective: Chief Complaint  Patient presents with  . Hip Pain    left   . Ear Problem    throbbing right  ear    Here for right ear issue. For a few weeks hearing heart beat swooshing in right ear, particularly at night when it is quiet.  No pain.  No ear drainage.    No cough, no sore throat, no nasal congestion.  No sneezing.  Left ear is fine.   No neck pain.  No prior ear irrigation due to impacted wax.   No headache, no numbness or tingling no weakness no confusion no slurred speech no dizziness.  She continues to smoke, she continues her statin regularly.  Her mother had a history of carotid endarterectomy.  This was in her 49s.  She continues to have left hip pain.  I saw her about a month ago for left trochanter bursitis.  She has been using Naprosyn daily but not much relief.  She did mow her yard recently which is a fairly large garden push mowing.  Had to get a friend to help her.  No new injury or trauma  No other aggravating or relieving factors. No other complaint.   Past Medical History:  Diagnosis Date  . Allergy   . Family history of premature coronary heart disease   . Grave's disease   . History of cervical cancer    s/p hysterectomy  . History of renal stone   . Hx of colonic polyps 2007  . Osteopenia    bone density scans 2015, 2020  . Tobacco use disorder   . Wears glasses    Current Outpatient Medications on File Prior to Visit  Medication Sig Dispense Refill  . aspirin EC 81 MG tablet Take 1 tablet (81 mg total) by mouth daily. 90 tablet 3  . cholecalciferol (VITAMIN D3) 25 MCG (1000 UT) tablet Take 1 tablet (1,000 Units total) by mouth daily. 90 tablet 3  . Cyanocobalamin (VITAMIN B 12 PO) Take by mouth.    Arna Medici 112 MCG tablet Take 1 tablet by mouth once daily 30 tablet 0  . fish oil-omega-3 fatty acids 1000 MG capsule Take 2 g by mouth daily.     Marland Kitchen lovastatin (MEVACOR) 10 MG tablet Take 1 tablet (10 mg total) by mouth at bedtime. 90 tablet 3  .  Multiple Vitamin (MULTIVITAMIN WITH MINERALS) TABS Take 1 tablet by mouth daily.    . naproxen (NAPROSYN) 375 MG tablet Take 1 tablet (375 mg total) by mouth 2 (two) times daily with a meal. 30 tablet 0  . nicotine (NICODERM CQ - DOSED IN MG/24 HR) 7 mg/24hr patch Place 7 mg onto the skin daily.    Marland Kitchen olmesartan-hydrochlorothiazide (BENICAR HCT) 20-12.5 MG tablet Take 1 tablet by mouth once daily 30 tablet 0   No current facility-administered medications on file prior to visit.    ROS as in subjective   Objective: BP 108/70   Pulse 68   Temp (!) 96.8 F (36 C)   Ht 5\' 5"  (1.651 m)   Wt 126 lb 3.2 oz (57.2 kg)   SpO2 95%   BMI 21.00 kg/m   General appearance: alert, no distress, WD/WN,  HEENT: normocephalic, sclerae anicteric, serous fluid behind right TM, otherwise TMs and canals normal nares patent, no discharge or erythema, pharynx normal Oral cavity: MMM, no lesions Neck: supple, no lymphadenopathy, no thyromegaly, no masses Heart: RRR, normal S1, S2, no murmurs Lungs: CTA bilaterally, no wheezes,  rhonchi, or rales Tender over left greater trochanter bursa, mild tenderness over the lateral thigh in the same area, mild tenderness with left hip range of motion but mainly in the area of the bursa, otherwise hip range of motion full, rest of leg nontender no deformity.  Otherwise bilateral legs nontender no deformity. Legs neurovascularly intact Back nontender normal range of motion    Assessment: Encounter Diagnoses  Name Primary?  . Trochanteric bursitis of right hip Yes  . Pulsatile tinnitus of right ear   . Family history of carotid artery stenosis   . Atherosclerosis of aorta ([place])   . Tobacco use disorder   . Hyperlipidemia, unspecified hyperlipidemia type      Plan: Trochanteric bursitis-not improved with NSAIDs and rest from last visit.  We discussed other possible therapies.  Discussed steroid injection.  She consents for injection.   Discussed risk and benefits  of procedure.  Patient gives consent.  Cleaned and prepped area in usual sterile fashion.   Injected the left trochanteric bursa with 40 mg of triamcinolone in 5 cc of Lidocaine with a 20-gauge needle (10cc syringe).  Estimated blood loss less than 1 cc, patient tolerated procedure well.  Discussed aftercare.    We discussed her symptoms and exam findings. She will begin 1 to 2-week history of either over-the-counter antihistamine or nasal steroid for possible serous otitis media which may or may not be causing her symptoms of pulsatile tinnitus.  Given her risk factors that we will set up for carotid ultrasound.  Continue statin, advised to stop smoking.  Follow-up pending ultrasound   Saline was seen today for hip pain and ear problem.  Diagnoses and all orders for this visit:  Trochanteric bursitis of right hip  Pulsatile tinnitus of right ear -     US Carotid Duplex Bilateral; Future  Family history of carotid artery stenosis -     US Carotid Duplex Bilateral; Future  Atherosclerosis of aorta (HCC) -     US Carotid Duplex Bilateral; Future  Tobacco use disorder -     US Carotid Duplex Bilateral; Future  Hyperlipidemia, unspecified hyperlipidemia type -     US Carotid Duplex Bilateral; Future   F/u pending [place]

## 2019-03-11 NOTE — Patient Instructions (Signed)
Recommendations:  Whooshing in ear  Begin over the counter zyrtec or allegra tablet daily for the next 1-2 weeks OR you can use FLonase nasal spray for the next 1-2 weeks given the fluid behind the right ear drum  We will schedule a carotid ultrasound to screen for cholesterol blockage, atherosclerosis as this can also cause these symptoms and given your mother's history of atherosclerosis  Trochanteric bursitis   We gave you a steroid shot in the bursa today  You can use the pain medication Hydrocodone as needed the next few days either twice daily or in the evening.  Caution as this can cause drowsiness.     You can use tylenol in the day time for pain   You can use ice therapy 20 minutes on, 20 minutes off the next 3 days  Rest

## 2019-03-13 NOTE — Addendum Note (Signed)
Addended by: Edgar Frisk on: 03/13/2019 09:10 AM   Modules accepted: Orders

## 2019-03-15 ENCOUNTER — Other Ambulatory Visit: Payer: Self-pay | Admitting: Cardiology

## 2019-03-15 ENCOUNTER — Other Ambulatory Visit: Payer: Self-pay | Admitting: Medical

## 2019-03-20 ENCOUNTER — Other Ambulatory Visit: Payer: Self-pay | Admitting: Otolaryngology

## 2019-03-20 ENCOUNTER — Other Ambulatory Visit: Payer: Self-pay

## 2019-03-20 DIAGNOSIS — H93A1 Pulsatile tinnitus, right ear: Secondary | ICD-10-CM

## 2019-04-04 ENCOUNTER — Other Ambulatory Visit: Payer: Self-pay

## 2019-04-04 ENCOUNTER — Ambulatory Visit
Admission: RE | Admit: 2019-04-04 | Discharge: 2019-04-04 | Disposition: A | Discharge status: Home / Self Care | Payer: Managed Care, Other (non HMO) | Source: Ambulatory Visit | Attending: Otolaryngology | Admitting: Otolaryngology

## 2019-04-04 DIAGNOSIS — H93A1 Pulsatile tinnitus, right ear: Secondary | ICD-10-CM

## 2019-04-08 ENCOUNTER — Other Ambulatory Visit: Payer: Self-pay | Admitting: Otolaryngology

## 2019-04-08 DIAGNOSIS — H93A1 Pulsatile tinnitus, right ear: Secondary | ICD-10-CM

## 2019-04-10 ENCOUNTER — Telehealth: Payer: Self-pay

## 2019-04-10 NOTE — Telephone Encounter (Signed)
Pt states that she is experiencing a sound in her ear like a heart beat in her right ear. She was seen by ENT and testing performed so she was cleared for anything related to that. The only change has been the addition of olmesartan so she is questioning if it could be related to that. Please review and advise.//ah

## 2019-04-10 NOTE — Telephone Encounter (Signed)
Pt aware of instructions. Will cb Mon or Tues.//ah

## 2019-04-10 NOTE — Telephone Encounter (Signed)
Potentially if that is the only thing that has changed. Can hold if for a few days and see if it improves. Would not go longer than a few days without medication. Let us know on Monday and we can send in something else if it improves

## 2019-04-15 ENCOUNTER — Telehealth: Payer: Self-pay

## 2019-04-15 NOTE — Telephone Encounter (Signed)
Patient left message stating she has started taking Olmesartan again. She stopped taking the medication for a few days and it did not make any difference with the heat beat sound in her ears. She will continue taking the Olmesartan.

## 2019-04-16 ENCOUNTER — Other Ambulatory Visit: Payer: Self-pay | Admitting: Medical

## 2019-04-16 ENCOUNTER — Other Ambulatory Visit: Payer: Self-pay | Admitting: Cardiology

## 2019-05-11 ENCOUNTER — Encounter: Payer: Self-pay | Admitting: Internal Medicine

## 2019-05-17 ENCOUNTER — Other Ambulatory Visit: Payer: Self-pay | Admitting: Cardiology

## 2019-05-17 ENCOUNTER — Other Ambulatory Visit: Payer: Self-pay | Admitting: Medical

## 2019-06-19 ENCOUNTER — Other Ambulatory Visit: Payer: Self-pay | Admitting: Medical

## 2019-06-19 ENCOUNTER — Other Ambulatory Visit: Payer: Self-pay | Admitting: Cardiology

## 2019-07-22 ENCOUNTER — Other Ambulatory Visit: Payer: Self-pay

## 2019-07-22 ENCOUNTER — Ambulatory Visit: Payer: Managed Care, Other (non HMO) | Admitting: Medical

## 2019-07-22 ENCOUNTER — Other Ambulatory Visit: Payer: Self-pay | Admitting: Medical

## 2019-07-22 ENCOUNTER — Ambulatory Visit
Admission: RE | Admit: 2019-07-22 | Discharge: 2019-07-22 | Disposition: A | Discharge status: Home / Self Care | Payer: Managed Care, Other (non HMO) | Source: Ambulatory Visit | Attending: Medical | Admitting: Medical

## 2019-07-22 VITALS — BP 128/70 | HR 66 | Temp 98.7°F | Ht 65.0 in | Wt 128.2 lb

## 2019-07-22 DIAGNOSIS — M25552 Pain in left hip: Secondary | ICD-10-CM

## 2019-07-22 DIAGNOSIS — M79605 Pain in left leg: Secondary | ICD-10-CM

## 2019-07-22 DIAGNOSIS — R1032 Left lower quadrant pain: Secondary | ICD-10-CM | POA: Diagnosis not present

## 2019-07-22 DIAGNOSIS — E038 Other specified hypothyroidism: Secondary | ICD-10-CM

## 2019-07-22 MED ORDER — HYDROCODONE-ACETAMINOPHEN 5-325 MG PO TABS: 1.0000 | ORAL_TABLET | Freq: Four times a day (QID) | ORAL | 0 refills | Status: DC | PRN

## 2019-07-22 MED ORDER — NAPROXEN 375 MG PO TABS: 375.0000 mg | ORAL_TABLET | Freq: Two times a day (BID) | ORAL | 0 refills | Status: DC

## 2019-07-22 NOTE — Progress Notes (Signed)
Subjective: Chief Complaint  Patient presents with  . Leg Pain    left   . Medication Refill    refill thyroid for #90   Here for recurrence of left leg pain.  I saw her back in November for trochanteric bursitis of left hip.  We used a steroid injection at the and she felt good relief for a few months.  Lately she is starting to have similar pains in her left hip, left lateral thigh, and even in the groin region.  She does use the stairs a lot at work and it seems to aggravate the pain.  She denies back pain.  No lower leg pain.  No numbness, tingling, weakness.  No bowel or bladder changes.   She wants to get this calmed down before she leaves to go to [place] in about 6 weeks.  Here for recheck on thyroid.  She would like her medicines to be changed to 9-day supply.  Feels fine otherwise  Past Medical History:  Diagnosis Date  . Allergy   . Family history of premature coronary heart disease   . Grave's disease   . History of cervical cancer    s/p hysterectomy  . History of renal stone   . Hx of colonic polyps 2007  . Osteopenia    bone density scans 2015, 2020  . Tobacco use disorder   . Wears glasses    Current Outpatient Medications on File Prior to Visit  Medication Sig Dispense Refill  . aspirin EC 81 MG tablet Take 1 tablet (81 mg total) by mouth daily. 90 tablet 3  . cholecalciferol (VITAMIN D3) 25 MCG (1000 UT) tablet Take 1 tablet (1,000 Units total) by mouth daily. 90 tablet 3  . Cyanocobalamin (VITAMIN B 12 PO) Take by mouth.    Arna Medici 112 MCG tablet Take 1 tablet by mouth once daily 30 tablet 0  . fish oil-omega-3 fatty acids 1000 MG capsule Take 2 g by mouth daily.     Marland Kitchen lovastatin (MEVACOR) 10 MG tablet Take 1 tablet (10 mg total) by mouth at bedtime. 90 tablet 3  . Multiple Vitamin (MULTIVITAMIN WITH MINERALS) TABS Take 1 tablet by mouth daily.    . nicotine (NICODERM CQ - DOSED IN MG/24 HR) 7 mg/24hr patch Place 7 mg onto the skin daily.    Marland Kitchen  olmesartan-hydrochlorothiazide (BENICAR HCT) 20-12.5 MG tablet Take 1 tablet by mouth once daily 90 tablet 1   No current facility-administered medications on file prior to visit.   Past Surgical History:  Procedure Laterality Date  . ABDOMINAL HYSTERECTOMY     ovaries intact; age 88yo  . COLONOSCOPY  age 70, 12/12/05   Dr. Verl Blalock  . COLONOSCOPY  11/2017   normal; Dr. Wilfrid Lund  . COLPOSCOPY     age 42  . VEIN SURGERY  1/12   laser ablation left great saphenous vein , stab phlebectomy of multiple varicosities, Dr. Donnetta Hutching      ROS as in subjective   Objective: BP 128/70   Pulse 66   Temp 98.7 F (37.1 C)   Ht 5\' 5"  (1.651 m)   Wt 128 lb 3.2 oz (58.2 kg)   SpO2 96%   BMI 21.33 kg/m   Gen: wd, wn, nad Tender along left lateral thigh and hip, pain with internal range of motion of left hip, otherwise nontender, no deformity, normal range of motion, rest of leg exam unremarkable Legs neurovascularly intact    Assessment: Encounter Diagnoses  Name Primary?  . Left hip pain Yes  . Pain in left leg   . Left inguinal pain   . Other specified hypothyroidism     Plan: Pain in left hip -I injected her with steroid for trochanteric bursitis back in November 2020 with good improvement.  She has a recurrence of pain but also has other pains that may not be specific to bursitis.  We will send for left hip x-ray and pelvis.    Hypothyroidism-her thyroid serology was a little off back in July 2020.  We increased her to 112 mcg of levothyroxine back in July.  Somewhere along the way it appears that the pharmacy switched her to Euthyrox brand.  Labs today, asymptomatic  Florie was seen today for leg pain and medication refill.  Diagnoses and all orders for this visit:  Left hip pain -     Cancel: DG HIP UNILAT WITH PELVIS MIN 4 VIEWS LEFT; Future  Pain in left leg -     Cancel: DG HIP UNILAT WITH PELVIS MIN 4 VIEWS LEFT; Future  Left inguinal pain -     Cancel: DG  HIP UNILAT WITH PELVIS MIN 4 VIEWS LEFT; Future  Other specified hypothyroidism -     T4, free -     TSH  Other orders -     naproxen (NAPROSYN) 375 MG tablet; Take 1 tablet (375 mg total) by mouth 2 (two) times daily with a meal. -     HYDROcodone-acetaminophen (NORCO) 5-325 MG tablet; Take 1 tablet by mouth every 6 (six) hours as needed.

## 2019-07-23 LAB — T4, FREE: Free T4: 1.63 ng/dL (ref 0.82–1.77)

## 2019-07-23 LAB — TSH: TSH: 1.13 u[IU]/mL (ref 0.450–4.500)

## 2019-07-24 ENCOUNTER — Other Ambulatory Visit: Payer: Self-pay | Admitting: Medical

## 2019-07-24 ENCOUNTER — Other Ambulatory Visit: Payer: Self-pay

## 2019-07-24 MED ORDER — LEVOTHYROXINE SODIUM 112 MCG PO TABS: 112.0000 ug | ORAL_TABLET | Freq: Every day | ORAL | 0 refills | Status: DC

## 2019-07-30 ENCOUNTER — Encounter: Payer: Self-pay | Admitting: Family Medicine

## 2019-07-30 ENCOUNTER — Ambulatory Visit: Payer: Managed Care, Other (non HMO) | Admitting: Family Medicine

## 2019-07-30 ENCOUNTER — Other Ambulatory Visit: Payer: Self-pay

## 2019-07-30 VITALS — BP 120/70 | HR 74 | Temp 98.4°F | Wt 126.0 lb

## 2019-07-30 DIAGNOSIS — M1612 Unilateral primary osteoarthritis, left hip: Secondary | ICD-10-CM

## 2019-07-30 NOTE — Patient Instructions (Signed)
Take naproxen twice per day and also take 2 Tylenol 4 times per day for pain relief.  Use the codeine if you need to but mainly at night

## 2019-07-30 NOTE — Progress Notes (Signed)
   Subjective:    [name], female    DOB: 1956-04-01, 64 y.o.   MRN: [id number]  HPI She is here for evaluation of continued difficulty with left hip pain.  She states that she is having difficulty with her ADLs, being able to walk, more her yard or do other activities around the house without pain.  She was given codeine to help with this.  Recent x-rays do show arthritic changes in the hip. She has a trip planned to [place] and would like some help controlling her pain.  Review of Systems     Objective:   Physical Exam Alert and in no distress.  Left hip exam does show pain on internal as well as external rotation with limitation of motion in those directions as well..  The x-rays were reviewed with her.       Assessment & Plan:  Arthritis of left hip - Plan: Ambulatory referral to Orthopedic Surgery I explained that she does indeed have arthritis in the hip and recommend getting an injection.  She is to try Tylenol, 2 tablets 4 times per day and also then use the Naprosyn and if needed the codeine but mainly at night.  Explained that eventually she is going to need a new hip.

## 2019-08-12 ENCOUNTER — Other Ambulatory Visit: Payer: Self-pay

## 2019-08-12 ENCOUNTER — Ambulatory Visit (INDEPENDENT_AMBULATORY_CARE_PROVIDER_SITE_OTHER): Payer: No Typology Code available for payment source | Admitting: Orthopaedic Surgery

## 2019-08-12 ENCOUNTER — Encounter: Payer: Self-pay | Admitting: Orthopaedic Surgery

## 2019-08-12 VITALS — Ht 63.78 in | Wt 126.0 lb

## 2019-08-12 DIAGNOSIS — M7062 Trochanteric bursitis, left hip: Secondary | ICD-10-CM | POA: Diagnosis not present

## 2019-08-12 MED ORDER — METHYLPREDNISOLONE ACETATE 40 MG/ML IJ SUSP
40.0000 mg | INTRAMUSCULAR | Status: AC | PRN
  Administered 2019-08-12: 40 mg via INTRA_ARTICULAR

## 2019-08-12 MED ORDER — LIDOCAINE HCL 1 % IJ SOLN
3.0000 mL | INTRAMUSCULAR | Status: AC | PRN
  Administered 2019-08-12: 3 mL

## 2019-08-12 MED ORDER — METHYLPREDNISOLONE 4 MG PO TABS: ORAL_TABLET | ORAL | 0 refills | Status: DC

## 2019-08-12 NOTE — Progress Notes (Signed)
Office Visit Note   Patient: Jodi Flynn           Date of Birth: 1956/03/29           MRN: [id number] Visit Date: 08/12/2019              Requested by: Denita Lung, MD [place],  [place] [id number] PCP: Carlena Hurl, PA-C   Assessment & Plan: Visit Diagnoses:  1. Trochanteric bursitis, left hip     Plan: Her signs and symptoms are consistent with severe trochanteric bursitis and IT band syndrome of the left hip area.  I recommended a combination of topical anti-inflammatory, stretching exercises, a steroid injection in this area as well as formal physical therapy.  I explained the rationale behind this and the risk and benefits involved of steroid injections and she tolerated it well.  She agrees with this treatment plan.  All question concerns were answered and addressed.  Follow-up as otherwise as needed.  Follow-Up Instructions: Return if symptoms worsen or fail to improve.   Orders:  Orders Placed This Encounter  Procedures  . Large Joint Inj   Meds ordered this encounter  Medications  . methylPREDNISolone (MEDROL) 4 MG tablet    Sig: Medrol dose pack. Take as instructed    Dispense:  21 tablet    Refill:  0      Procedures: Large Joint Inj: L greater trochanter on 08/12/2019 9:42 AM Indications: pain and diagnostic evaluation Details: 22 G 1.5 in needle, lateral approach  Arthrogram: No  Medications: 3 mL lidocaine 1 %; 40 mg methylPREDNISolone acetate 40 MG/ML Outcome: tolerated well, no immediate complications Procedure, treatment alternatives, risks and benefits explained, specific risks discussed. Consent was given by the patient. Immediately prior to procedure a time out was called to verify the correct patient, procedure, equipment, support staff and site/side marked as required. Patient was prepped and draped in the usual sterile fashion.       Clinical Data: No additional findings.   Subjective: Chief Complaint    Patient presents with  . Left Hip - Pain  The patient comes in today with severe left hip pain but she points to the trochanteric area and the IT band is the source of her pain.  It started around August of last year.  She was in the [place] and did a lot of walking and a lot of stepping.  She has trouble getting in and out of her car and she started walking limb.  She does report a little bit of groin pain but it seems to radiate from the lateral aspect of her left hip down to her knee.  She is never injured this area before.  She is otherwise a very active 64 year old female.  She reports that she is going on a trip to [place] on April 30.  She is not a diabetic.  She is not a smoker.  HPI  Review of Systems She currently denies any headache, chest pain, shortness of breath, fever, chills, nausea, vomiting  Objective: Vital Signs: Ht 5' 3.78" (1.62 m)   Wt 126 lb (57.2 kg)   BMI 21.78 kg/m   Physical Exam She is alert and oriented x3 and in no acute distress Ortho Exam Examination of her left hip shows full and fluid range of motion with no pain in the groin at all.  When I had her lay with her right side up she has severe pain to palpation of  the trochanteric area and IT band.  Her leg lengths are equal. Specialty Comments:  No specialty comments available.  Imaging: No results found. X-rays of her left hip on the canopy system that are reviewed independently showed no acute findings.  The joint space is well-maintained.  PMFS History: Patient Active Problem List   Diagnosis Date Noted  . Left hip pain 07/22/2019  . Pain in left leg 07/22/2019  . Left inguinal pain 07/22/2019  . Trochanteric bursitis of left hip 03/11/2019  . Pulsatile tinnitus of right ear 03/11/2019  . Family history of carotid artery stenosis 03/11/2019  . Vitamin D deficiency 12/15/2018  . Hyperlipidemia 11/19/2018  . Osteopenia 11/19/2018  . Screening for breast cancer 11/18/2017  . Screen for  colon cancer 11/18/2017  . Atherosclerosis of aorta ([place]) 08/09/2016  . RBBB 05/24/2016  . COPD suggested by initial evaluation ([place]) 04/10/2016  . Nonspecific abnormal electrocardiogram (ECG) (EKG) 04/10/2016  . Pulsatile abdominal mass 04/10/2016  . Post-menopausal 02/23/2016  . Abdominal bruit 02/23/2016  . Microscopic hematuria 02/23/2016  . Encounter for health maintenance examination in adult 02/18/2015  . Osteoporosis 02/26/2014  . Other specified hypothyroidism 02/26/2014  . Family history of premature coronary heart disease 02/25/2013  . History of Graves' disease 02/25/2013  . Colon polyp 02/25/2013  . Tobacco use disorder 02/25/2013   Past Medical History:  Diagnosis Date  . Allergy   . Family history of premature coronary heart disease   . Grave's disease   . History of cervical cancer    s/p hysterectomy  . History of renal stone   . Hx of colonic polyps 2007  . Osteopenia    bone density scans 2015, 2020  . Tobacco use disorder   . Wears glasses     Family History  Problem Relation Age of Onset  . COPD Mother        respiratory failure  . Kidney disease Mother   . Hypertension Mother   . Hyperlipidemia Mother   . Heart disease Father        died of MI, late 72s  . Hypertension Father   . Hyperlipidemia Father   . Stroke Father   . Hypertension Brother   . Hyperlipidemia Brother   . Kidney disease Brother        kidney stones  . Diabetes Brother        borderline  . Hyperlipidemia Brother   . Hypertension Brother   . Cancer Neg Hx   . Colon cancer Neg Hx   . Esophageal cancer Neg Hx   . Rectal cancer Neg Hx   . Stomach cancer Neg Hx     Past Surgical History:  Procedure Laterality Date  . ABDOMINAL HYSTERECTOMY     ovaries intact; age 36yo  . COLONOSCOPY  age 6, 12/12/05   Dr. Verl Blalock  . COLONOSCOPY  11/2017   normal; Dr. Wilfrid Lund  . COLPOSCOPY     age 8  . VEIN SURGERY  1/12   laser ablation left great saphenous vein , stab  phlebectomy of multiple varicosities, Dr. Donnetta Hutching    Social History   Occupational History  . Occupation: Engineer, drilling for [employer]    Employer: [employer]  Tobacco Use  . Smoking status: Current Some Day Smoker    Packs/day: 0.50    Years: 42.00    Pack years: 21.00    Types: Cigarettes  . Smokeless tobacco: Never Used  Substance and Sexual Activity  . Alcohol  use: Not Currently    Alcohol/week: 7.0 standard drinks    Types: 5 Glasses of wine, 2 Standard drinks or equivalent per week  . Drug use: No  . Sexual activity: Yes

## 2019-08-25 ENCOUNTER — Other Ambulatory Visit: Payer: Self-pay

## 2019-08-25 ENCOUNTER — Ambulatory Visit: Payer: No Typology Code available for payment source | Attending: Orthopaedic Surgery

## 2019-08-25 DIAGNOSIS — M25552 Pain in left hip: Secondary | ICD-10-CM | POA: Diagnosis not present

## 2019-08-25 DIAGNOSIS — R252 Cramp and spasm: Secondary | ICD-10-CM | POA: Insufficient documentation

## 2019-08-25 DIAGNOSIS — R262 Difficulty in walking, not elsewhere classified: Secondary | ICD-10-CM | POA: Diagnosis present

## 2019-08-25 NOTE — Therapy (Signed)
[address] [place], [place], [address] Phone: [phone]   Fax:  [phone]  Physical Therapy Evaluation  Patient Details  Name: Jodi Flynn MRN: [id number] Date of Birth: [birth year] Referring Provider (PT): Jean Rosenthal, MD   Encounter Date: 08/25/2019  PT End of Session - 08/25/19 0706    Visit Number  1    Number of Visits  10    Date for PT Re-Evaluation  10/09/19    Authorization Type  [employer]    PT Start Time  0700    PT Stop Time  0743    PT Time Calculation (min)  43 min    Activity Tolerance  Patient tolerated treatment well    Behavior During Therapy  [facility] for tasks assessed/performed       Past Medical History:  Diagnosis Date  . Allergy   . Family history of premature coronary heart disease   . Grave's disease   . History of cervical cancer    s/p hysterectomy  . History of renal stone   . Hx of colonic polyps 2007  . Osteopenia    bone density scans 2015, 2020  . Tobacco use disorder   . Wears glasses     Past Surgical History:  Procedure Laterality Date  . ABDOMINAL HYSTERECTOMY     ovaries intact; age 57yo  . COLONOSCOPY  age 39, 12/12/05   Dr. Verl Blalock  . COLONOSCOPY  11/2017   normal; Dr. Wilfrid Lund  . COLPOSCOPY     age 39  . VEIN SURGERY  1/12   laser ablation left great saphenous vein , stab phlebectomy of multiple varicosities, Dr. Donnetta Hutching     There were no vitals filed for this visit.   Subjective Assessment - 08/25/19 0702    Subjective  She reports bursitis LT hip.  Injection and prednisone has helped. She will be traveling this next week.    Limitations  Walking   lying on side , When bad can't lift leg without UE assist, stairs   How long can you walk comfortably?  As needed    Patient Stated Goals  Eliminate pain.    Currently in Pain?  No/denies         [facility] PT Assessment - 08/25/19 0001      Assessment   Medical Diagnosis  LT hip  bursitis    Referring Provider (PT)  Jean Rosenthal, MD    Onset Date/Surgical Date  --   11/2018   Next MD Visit  As needed    Prior Therapy  No      Precautions   Precautions  None      Restrictions   Weight Bearing Restrictions  No      Balance Screen   Has the patient fallen in the past 6 months  No      Prior Function   Level of Independence  Independent      Cognition   Overall Cognitive Status  Within Functional Limits for tasks assessed      Posture/Postural Control   Posture Comments  RT shoulder low, Lt pelvis high ( standing)       ROM / Strength   AROM / PROM / Strength  AROM;PROM;Strength      AROM   AROM Assessment Site  Hip      PROM   Overall PROM Comments  Hips WNL bilaterally      Strength   Strength Assessment Site  Hip;Knee  Right/Left Hip  Left;Right    Right Hip Flexion  5/5    Right Hip External Rotation   5/5    Right Hip Internal Rotation  5/5    Left Hip Flexion  4/5   pain   Left Hip External Rotation  5/5    Left Hip Internal Rotation  4/5   pain   Right/Left Knee  Left;Right    Right Knee Flexion  5/5    Right Knee Extension  5/5    Left Knee Flexion  5/5    Left Knee Extension  5/5      Flexibility   Soft Tissue Assessment /Muscle Length  yes    Hamstrings  80 degrees bilaterally    ITB  slightly tighter on the LT       Palpation   Palpation comment  L le functionally short LT ,   TL ASIS mor proximal  ,  LT ASIS more forward                Objective measurements completed on examination: See above findings.      [facility] Adult PT Treatment/Exercise - 08/25/19 0001      Exercises   Exercises  Knee/Hip      Knee/Hip Exercises: Stretches   Piriformis Stretch  Left;30 seconds;1 rep    Other Knee/Hip Stretches  hip flexion  1 rep 30 sec      Knee/Hip Exercises: Supine   Bridges  Both;10 reps             PT Education - 08/25/19 0748    Education Details  POC HEP    Person(s) Educated  Patient     Methods  Explanation;Demonstration;Tactile cues;Verbal cues;Handout    Comprehension  Verbalized understanding;Returned demonstration          PT Long Term Goals - 08/25/19 0753      PT LONG TERM GOAL #1   Title  She will be independent with all hEP issued    Time  5    Period  Weeks    Status  New      PT LONG TERM GOAL #2   Title  She will report no pain with walking stairs at work    Time  5    Period  Weeks    Status  New      PT LONG TERM GOAL #3   Title  She will be able to sleep without pain    Time  5    Period  Weeks    Status  New      PT LONG TERM GOAL #4   Title  She will be able to do all home tasks without pain.    Time  5    Period  Weeks    Status  New      PT LONG TERM GOAL #5   Title  FOTO score willl improve to 29% limited from 41% limited    Time  5    Period  Weeks    Status  New             Plan - 08/25/19 0749    Clinical Impression Statement  [name] presents with pain and tenderness Lt hip trochantor and soft tissues.   She has some weakness LT hip due to pain.  Her ROM is WNL though slight dec  hip adduction.  She should improve with skiled PT and consistent HEP. She will be away for 10  days so will start after she returns from her trip.    Personal Factors and Comorbidities  Past/Current Experience;Time since onset of injury/illness/exacerbation    Examination-Activity Limitations  Locomotion Level;Sit;Sleep;Stairs    Examination-Participation Restrictions  Art gallery manager;Valla Leaver [facility]    Stability/Clinical Decision Making  Stable/Uncomplicated    Clinical Decision Making  Low    Rehab Potential  Good    PT Frequency  2x / week    PT Duration  --   5 weeks   PT Treatment/Interventions  Electrical Stimulation;Cryotherapy;Moist Heat;Iontophoresis 4mg /ml Dexamethasone;Ultrasound;Therapeutic exercise;Manual techniques;Patient/family education;Passive range of motion;Dry needling;Taping    PT Next Visit Plan   review HEP and add as needed , mofdalities and manual for pain.    PT Home Exercise Plan  bridge,  SKC, pirifomis stretch    Consulted and Agree with Plan of Care  Patient       Patient will benefit from skilled therapeutic intervention in order to improve the following deficits and impairments:  Decreased range of motion, Pain, Decreased activity tolerance, Decreased strength, Increased muscle spasms  Visit Diagnosis: Pain in left hip  Cramp and spasm  Difficulty in walking, not elsewhere classified     Problem List Patient Active Problem List   Diagnosis Date Noted  . Left hip pain 07/22/2019  . Pain in left leg 07/22/2019  . Left inguinal pain 07/22/2019  . Trochanteric bursitis of left hip 03/11/2019  . Pulsatile tinnitus of right ear 03/11/2019  . Family history of carotid artery stenosis 03/11/2019  . Vitamin D deficiency 12/15/2018  . Hyperlipidemia 11/19/2018  . Osteopenia 11/19/2018  . Screening for breast cancer 11/18/2017  . Screen for colon cancer 11/18/2017  . Atherosclerosis of aorta ([place]) 08/09/2016  . RBBB 05/24/2016  . COPD suggested by initial evaluation ([place]) 04/10/2016  . Nonspecific abnormal electrocardiogram (ECG) (EKG) 04/10/2016  . Pulsatile abdominal mass 04/10/2016  . Post-menopausal 02/23/2016  . Abdominal bruit 02/23/2016  . Microscopic hematuria 02/23/2016  . Encounter for health maintenance examination in adult 02/18/2015  . Osteoporosis 02/26/2014  . Other specified hypothyroidism 02/26/2014  . Family history of premature coronary heart disease 02/25/2013  . History of Graves' disease 02/25/2013  . Colon polyp 02/25/2013  . Tobacco use disorder 02/25/2013    Darrel Hoover  PT 08/25/2019, 7:56 AM  [facility] [address] [place], [place], [address] Phone: [phone]   Fax:  [phone]  Name: Jodi Flynn MRN: [id number] Date of Birth: 1955-08-02

## 2019-08-25 NOTE — Patient Instructions (Addendum)
Bridge 10 reps 1-2x/day hold 5 sec,,, piriformis and SKC x 3 30 sec 2x/day      Ice 2x/day 10-15 min

## 2019-09-17 ENCOUNTER — Other Ambulatory Visit: Payer: Self-pay

## 2019-09-17 ENCOUNTER — Ambulatory Visit: Payer: No Typology Code available for payment source | Attending: Orthopaedic Surgery

## 2019-09-17 DIAGNOSIS — M25552 Pain in left hip: Secondary | ICD-10-CM | POA: Insufficient documentation

## 2019-09-17 DIAGNOSIS — R262 Difficulty in walking, not elsewhere classified: Secondary | ICD-10-CM | POA: Insufficient documentation

## 2019-09-17 DIAGNOSIS — R252 Cramp and spasm: Secondary | ICD-10-CM | POA: Diagnosis present

## 2019-09-17 NOTE — Patient Instructions (Signed)

## 2019-09-17 NOTE — Therapy (Signed)
[place], [place], [address] Phone: [phone]   Fax:  [phone]  Physical Therapy Treatment  Patient Details  Name: Jodi Flynn MRN: [id number] Date of Birth: Aug 19, 1955 Referring Provider (PT): Jean Rosenthal, MD   Encounter Date: 09/17/2019  PT End of Session - 09/17/19 1614    Visit Number  2    Number of Visits  10    Date for PT Re-Evaluation  10/09/19    Authorization Type  Aetna    PT Start Time  [phone]    PT Stop Time  0500    PT Time Calculation (min)  45 min    Activity Tolerance  Patient tolerated treatment well    Behavior During Therapy  [facility] for tasks assessed/performed       Past Medical History:  Diagnosis Date  . Allergy   . Family history of premature coronary heart disease   . Grave's disease   . History of cervical cancer    s/p hysterectomy  . History of renal stone   . Hx of colonic polyps 2007  . Osteopenia    bone density scans 2015, 2020  . Tobacco use disorder   . Wears glasses     Past Surgical History:  Procedure Laterality Date  . ABDOMINAL HYSTERECTOMY     ovaries intact; age 15yo  . COLONOSCOPY  age 27, 12/12/05   Dr. Verl Blalock  . COLONOSCOPY  11/2017   normal; Dr. Wilfrid Lund  . COLPOSCOPY     age 89  . VEIN SURGERY  1/12   laser ablation left great saphenous vein , stab phlebectomy of multiple varicosities, Dr. Donnetta Hutching     There were no vitals filed for this visit.  Subjective Assessment - 09/17/19 1619    Subjective  Able to walk during vacation.   Had hip pain .  A little  pain    Currently in Pain?  Yes    Pain Score  3     Pain Location  Hip    Pain Orientation  Left;Lateral    Pain Descriptors / Indicators  Aching    Pain Type  Chronic pain    Pain Onset  More than a month ago    Pain Frequency  Constant    Aggravating Factors   walking    Pain Relieving Factors  meds , ice                        [facility]  Adult PT Treatment/Exercise - 09/17/19 0001      Knee/Hip Exercises: Stretches   Piriformis Stretch  Left;30 seconds;1 rep    Other Knee/Hip Stretches  Knee to chest x 5 then x 2 20 sec      Knee/Hip Exercises: Aerobic   Nustep  L4 UE and LE x 5 min      Knee/Hip Exercises: Supine   Bridges  Both;15 reps      Modalities   Modalities  Iontophoresis      Iontophoresis   Type of Iontophoresis  Dexamethasone    Location  LT trochantor    Dose  1cc    Time  4-6 hours      Manual Therapy   Manual Therapy  Soft tissue mobilization;Manual Traction;Joint mobilization    Joint Mobilization  R 2-3 posterior glide    Soft tissue mobilization  Lt gluteals    Manual Traction  long axis pull LT leg  PT Education - 09/17/19 1705    Education Details  ionto info    Person(s) Educated  Patient    Methods  Explanation    Comprehension  Verbalized understanding          PT Long Term Goals - 08/25/19 0753      PT LONG TERM GOAL #1   Title  She will be independent with all hEP issued    Time  5    Period  Weeks    Status  New      PT LONG TERM GOAL #2   Title  She will report no pain with walking stairs at work    Time  5    Period  Weeks    Status  New      PT LONG TERM GOAL #3   Title  She will be able to sleep without pain    Time  5    Period  Weeks    Status  New      PT LONG TERM GOAL #4   Title  She will be able to do all home tasks without pain.    Time  5    Period  Weeks    Status  New      PT LONG TERM GOAL #5   Title  FOTO score willl improve to 29% limited from 41% limited    Time  5    Period  Weeks    Status  New            Plan - 09/17/19 1615    Clinical Impression Statement  She continues with hip pain  mild today but mod to severe in PM on vacation with extensive walking. Talked with her that making the hip so painful may set her progress back. Reviewed  ionto precautions. Will assess ionto and progress HEP Continue soft  tissue    PT Treatment/Interventions  Electrical Stimulation;Cryotherapy;Moist Heat;Iontophoresis 4mg /ml Dexamethasone;Ultrasound;Therapeutic exercise;Manual techniques;Patient/family education;Passive range of motion;Dry needling;Taping    PT Next Visit Plan  review HEP and add as needed , mofdalities and manual for pain.    PT Home Exercise Plan  bridge,  SKC, pirifomis stretch    Consulted and Agree with Plan of Care  Patient       Patient will benefit from skilled therapeutic intervention in order to improve the following deficits and impairments:  Decreased range of motion, Pain, Decreased activity tolerance, Decreased strength, Increased muscle spasms  Visit Diagnosis: Pain in left hip  Cramp and spasm  Difficulty in walking, not elsewhere classified     Problem List Patient Active Problem List   Diagnosis Date Noted  . Left hip pain 07/22/2019  . Pain in left leg 07/22/2019  . Left inguinal pain 07/22/2019  . Trochanteric bursitis of left hip 03/11/2019  . Pulsatile tinnitus of right ear 03/11/2019  . Family history of carotid artery stenosis 03/11/2019  . Vitamin D deficiency 12/15/2018  . Hyperlipidemia 11/19/2018  . Osteopenia 11/19/2018  . Screening for breast cancer 11/18/2017  . Screen for colon cancer 11/18/2017  . Atherosclerosis of aorta ([place]) 08/09/2016  . RBBB 05/24/2016  . COPD suggested by initial evaluation ([place]) 04/10/2016  . Nonspecific abnormal electrocardiogram (ECG) (EKG) 04/10/2016  . Pulsatile abdominal mass 04/10/2016  . Post-menopausal 02/23/2016  . Abdominal bruit 02/23/2016  . Microscopic hematuria 02/23/2016  . Encounter for health maintenance examination in adult 02/18/2015  . Osteoporosis 02/26/2014  . Other specified hypothyroidism 02/26/2014  . Family  history of premature coronary heart disease 02/25/2013  . History of Graves' disease 02/25/2013  . Colon polyp 02/25/2013  . Tobacco use disorder 02/25/2013    Darrel Hoover   PT 09/17/2019, 5:14 PM  [facility] [address] [place], [place], [address] Phone: [phone]   Fax:  [phone]  Name: Jodi Flynn MRN: [id number] Date of Birth: 1955-08-09

## 2019-09-22 ENCOUNTER — Encounter: Payer: Self-pay | Admitting: Physical Therapy

## 2019-09-22 ENCOUNTER — Ambulatory Visit: Payer: No Typology Code available for payment source | Admitting: Physical Therapy

## 2019-09-22 ENCOUNTER — Other Ambulatory Visit: Payer: Self-pay

## 2019-09-22 DIAGNOSIS — R262 Difficulty in walking, not elsewhere classified: Secondary | ICD-10-CM

## 2019-09-22 DIAGNOSIS — R252 Cramp and spasm: Secondary | ICD-10-CM

## 2019-09-22 DIAGNOSIS — M25552 Pain in left hip: Secondary | ICD-10-CM | POA: Diagnosis not present

## 2019-09-22 NOTE — Therapy (Signed)
[place], [place], [address] Phone: [phone]   Fax:  [phone]  Physical Therapy Treatment  Patient Details  Name: Jodi Flynn MRN: [id number] Date of Birth: 12/29/1955 Referring Provider (PT): Jean Rosenthal, MD   Encounter Date: 09/22/2019  PT End of Session - 09/22/19 1617    Visit Number  3    Number of Visits  10    Date for PT Re-Evaluation  10/09/19    Authorization Type  Aetna    PT Start Time  1616    PT Stop Time  1658    PT Time Calculation (min)  42 min    Activity Tolerance  Patient tolerated treatment well    Behavior During Therapy  [facility] for tasks assessed/performed       Past Medical History:  Diagnosis Date  . Allergy   . Family history of premature coronary heart disease   . Grave's disease   . History of cervical cancer    s/p hysterectomy  . History of renal stone   . Hx of colonic polyps 2007  . Osteopenia    bone density scans 2015, 2020  . Tobacco use disorder   . Wears glasses     Past Surgical History:  Procedure Laterality Date  . ABDOMINAL HYSTERECTOMY     ovaries intact; age 43yo  . COLONOSCOPY  age 51, 12/12/05   Dr. Verl Blalock  . COLONOSCOPY  11/2017   normal; Dr. Wilfrid Lund  . COLPOSCOPY     age 74  . VEIN SURGERY  1/12   laser ablation left great saphenous vein , stab phlebectomy of multiple varicosities, Dr. Donnetta Hutching     There were no vitals filed for this visit.  Subjective Assessment - 09/22/19 1620    Subjective  Patient reports she feels her hip is getting better. She continues to have most of her pain with stairs, mainly going up. Also notes pain with lying on the left side.    Patient Stated Goals  Eliminate pain.    Currently in Pain?  Yes    Pain Score  1     Pain Location  Hip    Pain Orientation  Left;Lateral    Pain Descriptors / Indicators  Aching    Pain Type  Chronic pain    Pain Onset  More than a month ago    Pain Frequency  Intermittent    Aggravating Factors   Walking    Pain Relieving Factors  Meds, ice         [facility] PT Assessment - 09/22/19 0001      Strength   Left Hip ABduction  3-/5   unable to lift leg through full range against gravity                   [facility] Adult PT Treatment/Exercise - 09/22/19 0001      Knee/Hip Exercises: Aerobic   Nustep  L4 UE and LE x 5 min      Knee/Hip Exercises: Standing   Hip Abduction  2 sets;10 reps    Abduction Limitations  2nd set performed with yellow band around knees, cueing for proper form    Other Standing Knee Exercises  Self massage to gluteal and TFL region using tennis ball against wall      Knee/Hip Exercises: Supine   Bridges  3 sets;10 reps    Bridges Limitations  cued to avoid valgus/hip adduction, last 2 sets with  yellow band around knees    Other Supine Knee/Hip Exercises  Clamshell with yellow band 2x10      Knee/Hip Exercises: Sidelying   Hip ABduction Limitations  Unable to perform    Clams  Trialed 10 reps, patient unable to perform through full range and with increased pain             PT Education - 09/22/19 1616    Education Details  HEP    Person(s) Educated  Patient    Methods  Explanation;Demonstration;Verbal cues    Comprehension  Verbalized understanding;Returned demonstration;Verbal cues required;Need further instruction          PT Long Term Goals - 08/25/19 0753      PT LONG TERM GOAL #1   Title  She will be independent with all hEP issued    Time  5    Period  Weeks    Status  New      PT LONG TERM GOAL #2   Title  She will report no pain with walking stairs at work    Time  5    Period  Weeks    Status  New      PT LONG TERM GOAL #3   Title  She will be able to sleep without pain    Time  5    Period  Weeks    Status  New      PT LONG TERM GOAL #4   Title  She will be able to do all home tasks without pain.    Time  5    Period  Weeks    Status  New      PT  LONG TERM GOAL #5   Title  FOTO score willl improve to 29% limited from 41% limited    Time  5    Period  Weeks    Status  New            Plan - 09/22/19 1617    Clinical Impression Statement  Patient tolerated therapy well with no adverse effects. Re-instructed on using tennis ball for self massage against a wall with good tolerance. She exhibits signficant left hip abductor weakness that leads to contralateral pelvic drop and hip adduciton with stairs most likely increasing hip pain. Added in hip abductor stengthening using band with good tolerance. She required cueing to avoid knee valgus/hip adduction with exercises and stairs. Patient would benefit from continued skilled PT to progress strength and tolerance for activities such as walking and stairs.    PT Treatment/Interventions  Electrical Stimulation;Cryotherapy;Moist Heat;Iontophoresis 4mg /ml Dexamethasone;Ultrasound;Therapeutic exercise;Manual techniques;Patient/family education;Passive range of motion;Dry needling;Taping    PT Next Visit Plan  review HEP and add as needed , mofdalities and manual for pain.    PT Home Exercise Plan  bridge with yellow band, supine clam with yellow, SKC, pirifomis stretch    Consulted and Agree with Plan of Care  Patient       Patient will benefit from skilled therapeutic intervention in order to improve the following deficits and impairments:  Decreased range of motion, Pain, Decreased activity tolerance, Decreased strength, Increased muscle spasms  Visit Diagnosis: Pain in left hip  Cramp and spasm  Difficulty in walking, not elsewhere classified     Problem List Patient Active Problem List   Diagnosis Date Noted  . Left hip pain 07/22/2019  . Pain in left leg 07/22/2019  . Left inguinal pain 07/22/2019  . Trochanteric bursitis of left hip 03/11/2019  .  Pulsatile tinnitus of right ear 03/11/2019  . Family history of carotid artery stenosis 03/11/2019  . Vitamin D deficiency  12/15/2018  . Hyperlipidemia 11/19/2018  . Osteopenia 11/19/2018  . Screening for breast cancer 11/18/2017  . Screen for colon cancer 11/18/2017  . Atherosclerosis of aorta ([place]) 08/09/2016  . RBBB 05/24/2016  . COPD suggested by initial evaluation ([place]) 04/10/2016  . Nonspecific abnormal electrocardiogram (ECG) (EKG) 04/10/2016  . Pulsatile abdominal mass 04/10/2016  . Post-menopausal 02/23/2016  . Abdominal bruit 02/23/2016  . Microscopic hematuria 02/23/2016  . Encounter for health maintenance examination in adult 02/18/2015  . Osteoporosis 02/26/2014  . Other specified hypothyroidism 02/26/2014  . Family history of premature coronary heart disease 02/25/2013  . History of Graves' disease 02/25/2013  . Colon polyp 02/25/2013  . Tobacco use disorder 02/25/2013    Hilda Blades, PT, DPT, LAT, ATC 09/22/19  5:10 PM Phone: [phone] Fax: [facility] [address] [place], [place], [address] Phone: [phone]   Fax:  [phone]  Name: Jodi Flynn MRN: [id number] Date of Birth: Sep 25, 1955

## 2019-09-24 ENCOUNTER — Ambulatory Visit: Payer: No Typology Code available for payment source

## 2019-09-24 ENCOUNTER — Other Ambulatory Visit: Payer: Self-pay

## 2019-09-24 DIAGNOSIS — R262 Difficulty in walking, not elsewhere classified: Secondary | ICD-10-CM

## 2019-09-24 DIAGNOSIS — M25552 Pain in left hip: Secondary | ICD-10-CM

## 2019-09-24 DIAGNOSIS — R252 Cramp and spasm: Secondary | ICD-10-CM

## 2019-09-24 NOTE — Therapy (Signed)
[place], [place], [address] Phone: [phone]   Fax:  [phone]  Physical Therapy Treatment  Patient Details  Name: Jodi Flynn MRN: [id number] Date of Birth: 10/28/1955 Referring Provider (PT): Jean Rosenthal, MD   Encounter Date: 09/24/2019  PT End of Session - 09/24/19 1705    Visit Number  4    Number of Visits  10    Date for PT Re-Evaluation  10/09/19    Authorization Type  Aetna    PT Start Time  [phone]    PT Stop Time  0457    PT Time Calculation (min)  42 min    Activity Tolerance  Patient tolerated treatment well    Behavior During Therapy  [facility] for tasks assessed/performed       Past Medical History:  Diagnosis Date  . Allergy   . Family history of premature coronary heart disease   . Grave's disease   . History of cervical cancer    s/p hysterectomy  . History of renal stone   . Hx of colonic polyps 2007  . Osteopenia    bone density scans 2015, 2020  . Tobacco use disorder   . Wears glasses     Past Surgical History:  Procedure Laterality Date  . ABDOMINAL HYSTERECTOMY     ovaries intact; age 6yo  . COLONOSCOPY  age 29, 12/12/05   Dr. Verl Blalock  . COLONOSCOPY  11/2017   normal; Dr. Wilfrid Lund  . COLPOSCOPY     age 10  . VEIN SURGERY  1/12   laser ablation left great saphenous vein , stab phlebectomy of multiple varicosities, Dr. Donnetta Hutching     There were no vitals filed for this visit.  Subjective Assessment - 09/24/19 1711    Subjective  She reportd hip is improing . Wondering why she is haveing pain.  Able to walk stairs at work without significant incr pain    Pain Score  1     Pain Location  Hip    Pain Orientation  Left;Lateral    Pain Descriptors / Indicators  Aching    Pain Type  Chronic pain    Pain Onset  More than a month ago    Pain Frequency  Intermittent    Aggravating Factors   walk , lye On Lt side    Pain Relieving Factors  meds ,  cold                        [facility] Adult PT Treatment/Exercise - 09/24/19 0001      Knee/Hip Exercises: Stretches   Piriformis Stretch  Left;2 reps;30 seconds      Knee/Hip Exercises: Aerobic   Nustep  LE L4 5 min      Knee/Hip Exercises: Standing   Hip Abduction  2 sets;10 reps      Knee/Hip Exercises: Supine   Bridges  Both;15 reps;2 sets    Bridges Limitations  With PPT      Knee/Hip Exercises: Sidelying   Hip ABduction  Left;15 reps    Clams  2x10 reps LT      Modalities   Modalities  Ultrasound      Ultrasound   Ultrasound Location  LT hip    Ultrasound Parameters  100% 1.6Wcm2 1 MHZ    Ultrasound Goals  Pain      Iontophoresis   Type of Iontophoresis  Dexamethasone    Location  LT  trochantor    Dose  1cc    Time  4-6 hours      Manual Therapy   Joint Mobilization  --    Soft tissue mobilization  Lt gluteals, anterior , laterlka / posterio    Manual Traction  --             PT Education - 09/24/19 1705    Education Details  reviwed ionto precations    Person(s) Educated  Patient    Methods  Explanation    Comprehension  Verbalized understanding          PT Long Term Goals - 08/25/19 0753      PT LONG TERM GOAL #1   Title  She will be independent with all hEP issued    Time  5    Period  Weeks    Status  New      PT LONG TERM GOAL #2   Title  She will report no pain with walking stairs at work    Time  5    Period  Weeks    Status  New      PT LONG TERM GOAL #3   Title  She will be able to sleep without pain    Time  5    Period  Weeks    Status  New      PT LONG TERM GOAL #4   Title  She will be able to do all home tasks without pain.    Time  5    Period  Weeks    Status  New      PT LONG TERM GOAL #5   Title  FOTO score willl improve to 29% limited from 41% limited    Time  5    Period  Weeks    Status  New            Plan - 09/24/19 1706    Clinical Impression Statement  Pain improved no  repored pain wih HEP now. Ionto no irritation. Continues with  very slight   limp.  Reported walking staris at work with no significant incr pain.    PT Treatment/Interventions  Electrical Stimulation;Cryotherapy;Moist Heat;Iontophoresis 4mg /ml Dexamethasone;Ultrasound;Therapeutic exercise;Manual techniques;Patient/family education;Passive range of motion;Dry needling;Taping    PT Next Visit Plan  review HEP and add as needed , modalities and manual for pain. FOTO    PT Home Exercise Plan  bridge with yellow band, supine clam with yellow, SKC, pirifomis stretch    Consulted and Agree with Plan of Care  Patient       Patient will benefit from skilled therapeutic intervention in order to improve the following deficits and impairments:  Decreased range of motion, Pain, Decreased activity tolerance, Decreased strength, Increased muscle spasms  Visit Diagnosis: Pain in left hip  Cramp and spasm  Difficulty in walking, not elsewhere classified     Problem List Patient Active Problem List   Diagnosis Date Noted  . Left hip pain 07/22/2019  . Pain in left leg 07/22/2019  . Left inguinal pain 07/22/2019  . Trochanteric bursitis of left hip 03/11/2019  . Pulsatile tinnitus of right ear 03/11/2019  . Family history of carotid artery stenosis 03/11/2019  . Vitamin D deficiency 12/15/2018  . Hyperlipidemia 11/19/2018  . Osteopenia 11/19/2018  . Screening for breast cancer 11/18/2017  . Screen for colon cancer 11/18/2017  . Atherosclerosis of aorta ([place]) 08/09/2016  . RBBB 05/24/2016  . COPD suggested by initial evaluation ([place])  04/10/2016  . Nonspecific abnormal electrocardiogram (ECG) (EKG) 04/10/2016  . Pulsatile abdominal mass 04/10/2016  . Post-menopausal 02/23/2016  . Abdominal bruit 02/23/2016  . Microscopic hematuria 02/23/2016  . Encounter for health maintenance examination in adult 02/18/2015  . Osteoporosis 02/26/2014  . Other specified hypothyroidism 02/26/2014  . Family  history of premature coronary heart disease 02/25/2013  . History of Graves' disease 02/25/2013  . Colon polyp 02/25/2013  . Tobacco use disorder 02/25/2013    Darrel Hoover   PT 09/24/2019, 5:12 PM  [facility] [address] [place], [place], [address] Phone: [phone]   Fax:  [phone]  Name: Jodi Flynn MRN: [id number] Date of Birth: Sep 25, 1955

## 2019-09-29 ENCOUNTER — Ambulatory Visit: Payer: No Typology Code available for payment source | Attending: Orthopaedic Surgery

## 2019-09-29 ENCOUNTER — Other Ambulatory Visit: Payer: Self-pay

## 2019-09-29 DIAGNOSIS — R252 Cramp and spasm: Secondary | ICD-10-CM | POA: Insufficient documentation

## 2019-09-29 DIAGNOSIS — R262 Difficulty in walking, not elsewhere classified: Secondary | ICD-10-CM | POA: Insufficient documentation

## 2019-09-29 DIAGNOSIS — M25552 Pain in left hip: Secondary | ICD-10-CM | POA: Insufficient documentation

## 2019-09-29 NOTE — Therapy (Signed)
[place], [place], [address] Phone: [phone]   Fax:  [phone]  Physical Therapy Treatment  Patient Details  Name: Jodi Flynn MRN: [id number] Date of Birth: 1955-11-12 Referring Provider (PT): Jean Rosenthal, MD   Encounter Date: 09/29/2019  PT End of Session - 09/29/19 1620    Visit Number  5    Number of Visits  10    Date for PT Re-Evaluation  10/09/19    Authorization Type  Aetna    PT Start Time  [phone]    PT Stop Time  0500    PT Time Calculation (min)  40 min    Activity Tolerance  Patient tolerated treatment well    Behavior During Therapy  [facility] for tasks assessed/performed       Past Medical History:  Diagnosis Date  . Allergy   . Family history of premature coronary heart disease   . Grave's disease   . History of cervical cancer    s/p hysterectomy  . History of renal stone   . Hx of colonic polyps 2007  . Osteopenia    bone density scans 2015, 2020  . Tobacco use disorder   . Wears glasses     Past Surgical History:  Procedure Laterality Date  . ABDOMINAL HYSTERECTOMY     ovaries intact; age 41yo  . COLONOSCOPY  age 64, 12/12/05   Dr. Verl Blalock  . COLONOSCOPY  11/2017   normal; Dr. Wilfrid Lund  . COLPOSCOPY     age 66  . VEIN SURGERY  1/12   laser ablation left great saphenous vein , stab phlebectomy of multiple varicosities, Dr. Donnetta Hutching     There were no vitals filed for this visit.  Subjective Assessment - 09/29/19 1623    Subjective  Hip pain with walking is getting better but stairs is still more problematic.    Pain Score  1     Pain Location  Hip    Pain Orientation  Left;Lateral    Pain Descriptors / Indicators  Aching    Pain Type  Chronic pain    Pain Onset  More than a month ago    Pain Frequency  Intermittent    Aggravating Factors   stairs    Pain Relieving Factors  rest cold                        [facility] Adult PT  Treatment/Exercise - 09/29/19 0001      Knee/Hip Exercises: Stretches   Piriformis Stretch  Left;2 reps;30 seconds      Knee/Hip Exercises: Aerobic   Nustep  LE L4 5 min      Knee/Hip Exercises: Standing   Other Standing Knee Exercises  LT leg on bos Lift RT leg with elevating pelvi with IR of RT hip 10 sec , after legs = length in supine  Lt hip pain with this so 5 reps and done      Knee/Hip Exercises: Supine   Bridges  Both;15 reps;2 sets    Bridges Limitations  With PPT    Other Supine Knee/Hip Exercises  LEgs 90/90 on chair with PPT and isometric hamstrings  with lift og LT leg. ,  LT +OBERS improed      Iontophoresis   Type of Iontophoresis  Dexamethasone    Location  LT trochantor    Dose  1cc    Time  4-6 hours  Manual Therapy   Soft tissue mobilization  Lt gluteals, anterior , laterlka / posterior    TFl stretch                  PT Long Term Goals - 09/29/19 1729      PT LONG TERM GOAL #1   Title  She will be independent with all hEP issued    Status  On-going      PT LONG TERM GOAL #2   Title  She will report no pain with walking stairs at work    Baseline  improve ut still with pain    Status  On-going      PT LONG TERM GOAL #3   Title  She will be able to sleep without pain    Baseline  improed but still occasional disturbance    Status  On-going      PT LONG TERM GOAL #4   Title  She will be able to do all home tasks without pain.    Baseline  Improved but still some pain    Status  On-going      PT LONG TERM GOAL #5   Title  FOTO score willl improve to 29% limited from 41% limited    Status  Unable to assess            Plan - 09/29/19 1726    Clinical Impression Statement  Pain no worse post session. Staris still an issue.  May have some SI issues but still looks like it's bursitis.  ionto still helpful.    Will progress strength as tolerated    PT Treatment/Interventions  Electrical Stimulation;Cryotherapy;Moist  Heat;Iontophoresis 4mg /ml Dexamethasone;Ultrasound;Therapeutic exercise;Manual techniques;Patient/family education;Passive range of motion;Dry needling;Taping    PT Next Visit Plan  review HEP and add as needed , modalities and manual for pain.                                    FOTO    PT Home Exercise Plan  bridge with yellow band, supine clam with yellow, SKC, pirifomis stretch,  PEC exrercisre with LT leg lift    Consulted and Agree with Plan of Care  Patient       Patient will benefit from skilled therapeutic intervention in order to improve the following deficits and impairments:  Decreased range of motion, Pain, Decreased activity tolerance, Decreased strength, Increased muscle spasms  Visit Diagnosis: Pain in left hip  Cramp and spasm  Difficulty in walking, not elsewhere classified     Problem List Patient Active Problem List   Diagnosis Date Noted  . Left hip pain 07/22/2019  . Pain in left leg 07/22/2019  . Left inguinal pain 07/22/2019  . Trochanteric bursitis of left hip 03/11/2019  . Pulsatile tinnitus of right ear 03/11/2019  . Family history of carotid artery stenosis 03/11/2019  . Vitamin D deficiency 12/15/2018  . Hyperlipidemia 11/19/2018  . Osteopenia 11/19/2018  . Screening for breast cancer 11/18/2017  . Screen for colon cancer 11/18/2017  . Atherosclerosis of aorta ([place]) 08/09/2016  . RBBB 05/24/2016  . COPD suggested by initial evaluation ([place]) 04/10/2016  . Nonspecific abnormal electrocardiogram (ECG) (EKG) 04/10/2016  . Pulsatile abdominal mass 04/10/2016  . Post-menopausal 02/23/2016  . Abdominal bruit 02/23/2016  . Microscopic hematuria 02/23/2016  . Encounter for health maintenance examination in adult 02/18/2015  . Osteoporosis 02/26/2014  . Other specified hypothyroidism 02/26/2014  .  Family history of premature coronary heart disease 02/25/2013  . History of Graves' disease 02/25/2013  . Colon polyp 02/25/2013  . Tobacco use disorder  02/25/2013    Darrel Hoover  PT 09/29/2019, 5:32 PM  [facility] [address] [place], [place], [address] Phone: [phone]   Fax:  [phone]  Name: Jodi Flynn MRN: [id number] Date of Birth: 03-06-1956

## 2019-10-06 ENCOUNTER — Ambulatory Visit: Payer: No Typology Code available for payment source

## 2019-10-06 ENCOUNTER — Other Ambulatory Visit: Payer: Self-pay

## 2019-10-06 DIAGNOSIS — M25552 Pain in left hip: Secondary | ICD-10-CM | POA: Diagnosis not present

## 2019-10-06 DIAGNOSIS — R262 Difficulty in walking, not elsewhere classified: Secondary | ICD-10-CM

## 2019-10-06 DIAGNOSIS — R252 Cramp and spasm: Secondary | ICD-10-CM

## 2019-10-06 NOTE — Therapy (Signed)
[place], [place], [address] Phone: [phone]   Fax:  [phone]  Physical Therapy Treatment  Patient Details  Name: Jodi Flynn MRN: [id number] Date of Birth: 01-11-1956 Referring Provider (PT): Jean Rosenthal, MD   Encounter Date: 10/06/2019  PT End of Session - 10/06/19 1616    Visit Number  6    Number of Visits  10    Date for PT Re-Evaluation  10/09/19    Authorization Type  Aetna    PT Start Time  [phone]    PT Stop Time  0454    PT Time Calculation (min)  39 min    Activity Tolerance  Patient tolerated treatment well    Behavior During Therapy  [facility] for tasks assessed/performed       Past Medical History:  Diagnosis Date  . Allergy   . Family history of premature coronary heart disease   . Grave's disease   . History of cervical cancer    s/p hysterectomy  . History of renal stone   . Hx of colonic polyps 2007  . Osteopenia    bone density scans 2015, 2020  . Tobacco use disorder   . Wears glasses     Past Surgical History:  Procedure Laterality Date  . ABDOMINAL HYSTERECTOMY     ovaries intact; age 64yo  . COLONOSCOPY  age 64, 12/12/05   Dr. Verl Blalock  . COLONOSCOPY  11/2017   normal; Dr. Wilfrid Lund  . COLPOSCOPY     age 64  . VEIN SURGERY  1/12   laser ablation left great saphenous vein , stab phlebectomy of multiple varicosities, Dr. Donnetta Hutching     There were no vitals filed for this visit.  Subjective Assessment - 10/06/19 1620    Subjective  Hip is a whole lot better .  Stairs is better but still tender. Less hesitation to walk steps.  walking better.  AM is great    Currently in Pain?  No/denies                        [facility] Adult PT Treatment/Exercise - 10/06/19 0001      Knee/Hip Exercises: Stretches   Piriformis Stretch  Left;30 seconds;3 reps;20 seconds      Knee/Hip Exercises: Aerobic   Nustep  LE L5 5 min      Knee/Hip  Exercises: Supine   Bridges  Both    Bridges Limitations  With PPT  30 reps      Knee/Hip Exercises: Sidelying   Hip ABduction  Left;15 reps    Hip ABduction Limitations  red band    Clams  2x10 reps LT    Other Sidelying Knee/Hip Exercises  red band    Other Sidelying Knee/Hip Exercises  reverse clam x 15 no band      Iontophoresis   Type of Iontophoresis  Dexamethasone    Location  LT trochantor    Dose  1cc    Time  4-6 hours      Manual Therapy   Soft tissue mobilization  Lt gluteals, anterior , laterlka / posterior    TFl stretch                  PT Long Term Goals - 10/06/19 1707      PT LONG TERM GOAL #1   Title  She will be independent with all hEP issued    Status  On-going  PT LONG TERM GOAL #2   Title  She will report no pain with walking stairs at work    Baseline  improved    Status  On-going      PT LONG TERM GOAL #3   Title  She will be able to sleep without pain    Status  Achieved      PT LONG TERM GOAL #4   Title  She will be able to do all home tasks without pain.    Baseline  Improved but still some pain    Status  On-going      PT LONG TERM GOAL #5   Title  FOTO score willl improve to 29% limited from 41% limited    Baseline  21% today    Status  Achieved            Plan - 10/06/19 1629    Clinical Impression Statement  FOTO score exceeded expectation in less time. subjectively and functonally she is much improved from last visit. Still some soreness with stairs and is tender to touch but less. Will continue ionto as she is improving    PT Treatment/Interventions  Electrical Stimulation;Cryotherapy;Moist Heat;Iontophoresis 4mg /ml Dexamethasone;Ultrasound;Therapeutic exercise;Manual techniques;Patient/family education;Passive range of motion;Dry needling;Taping    PT Next Visit Plan  continue with red band sidelye exercise inoto and manual and try ox standing exercise againe    PT Home Exercise Plan  bridge with yellow band,  supine clam with yellow, SKC, pirifomis stretch,  PEC exrercisre with LT leg lift    Consulted and Agree with Plan of Care  Patient       Patient will benefit from skilled therapeutic intervention in order to improve the following deficits and impairments:  Decreased range of motion, Pain, Decreased activity tolerance, Decreased strength, Increased muscle spasms  Visit Diagnosis: Difficulty in walking, not elsewhere classified  Cramp and spasm  Pain in left hip     Problem List Patient Active Problem List   Diagnosis Date Noted  . Left hip pain 07/22/2019  . Pain in left leg 07/22/2019  . Left inguinal pain 07/22/2019  . Trochanteric bursitis of left hip 03/11/2019  . Pulsatile tinnitus of right ear 03/11/2019  . Family history of carotid artery stenosis 03/11/2019  . Vitamin D deficiency 12/15/2018  . Hyperlipidemia 11/19/2018  . Osteopenia 11/19/2018  . Screening for breast cancer 11/18/2017  . Screen for colon cancer 11/18/2017  . Atherosclerosis of aorta ([place]) 08/09/2016  . RBBB 05/24/2016  . COPD suggested by initial evaluation ([place]) 04/10/2016  . Nonspecific abnormal electrocardiogram (ECG) (EKG) 04/10/2016  . Pulsatile abdominal mass 04/10/2016  . Post-menopausal 02/23/2016  . Abdominal bruit 02/23/2016  . Microscopic hematuria 02/23/2016  . Encounter for health maintenance examination in adult 02/18/2015  . Osteoporosis 02/26/2014  . Other specified hypothyroidism 02/26/2014  . Family history of premature coronary heart disease 02/25/2013  . History of Graves' disease 02/25/2013  . Colon polyp 02/25/2013  . Tobacco use disorder 02/25/2013    Darrel Hoover  PT 10/06/2019, 5:08 PM  [facility] [address] [place], [place], [address] Phone: [phone]   Fax:  [phone]  Name: Nohely R Flynn MRN: [id number] Date of Birth: [birth year]

## 2019-10-08 ENCOUNTER — Other Ambulatory Visit: Payer: Self-pay

## 2019-10-08 ENCOUNTER — Ambulatory Visit: Payer: No Typology Code available for payment source

## 2019-10-08 DIAGNOSIS — M25552 Pain in left hip: Secondary | ICD-10-CM

## 2019-10-08 DIAGNOSIS — R262 Difficulty in walking, not elsewhere classified: Secondary | ICD-10-CM

## 2019-10-08 DIAGNOSIS — R252 Cramp and spasm: Secondary | ICD-10-CM

## 2019-10-08 NOTE — Therapy (Signed)
[place], [place], [address] Phone: [phone]   Fax:  [phone]  Physical Therapy Treatment  Patient Details  Name: Jodi Flynn MRN: [id number] Date of Birth: 1955-07-13 Referring Provider (PT): Jean Rosenthal, MD   Encounter Date: 10/08/2019   PT End of Session - 10/08/19 1704    Visit Number 7    Number of Visits 10    Date for PT Re-Evaluation 10/09/19    Authorization Type Aetna    PT Start Time [phone]    PT Stop Time 0455    PT Time Calculation (min) 39 min    Activity Tolerance Patient tolerated treatment well    Behavior During Therapy [facility] for tasks assessed/performed           Past Medical History:  Diagnosis Date  . Allergy   . Family history of premature coronary heart disease   . Grave's disease   . History of cervical cancer    s/p hysterectomy  . History of renal stone   . Hx of colonic polyps 2007  . Osteopenia    bone density scans 2015, 2020  . Tobacco use disorder   . Wears glasses     Past Surgical History:  Procedure Laterality Date  . ABDOMINAL HYSTERECTOMY     ovaries intact; age 89yo  . COLONOSCOPY  age 71, 12/12/05   Dr. Verl Blalock  . COLONOSCOPY  11/2017   normal; Dr. Wilfrid Lund  . COLPOSCOPY     age 73  . VEIN SURGERY  1/12   laser ablation left great saphenous vein , stab phlebectomy of multiple varicosities, Dr. Donnetta Hutching     There were no vitals filed for this visit.   Subjective Assessment - 10/08/19 1703    Subjective Still doing well Only discomfort on stairs but generally no pain for all activity. Tenderness decr    Currently in Pain? No/denies                             [facility] Adult PT Treatment/Exercise - 10/08/19 0001      Knee/Hip Exercises: Aerobic   Nustep LE L5 5 min      Knee/Hip Exercises: Supine   Bridges Both    Bridges Limitations With PPT  30 reps      Knee/Hip Exercises: Sidelying   Hip  ABduction Left;15 reps    Hip ABduction Limitations red band    Clams 2x10 reps LT    Other Sidelying Knee/Hip Exercises red band    Other Sidelying Knee/Hip Exercises reverse clam x 15 no band      Iontophoresis   Type of Iontophoresis Dexamethasone    Location LT trochantor    Dose 1cc    Time 4-6 hours      Manual Therapy   Soft tissue mobilization Lt gluteals, anterior , laterlka / posterior    TFl stretch                       PT Long Term Goals - 10/06/19 1707      PT LONG TERM GOAL #1   Title She will be independent with all hEP issued    Status On-going      PT LONG TERM GOAL #2   Title She will report no pain with walking stairs at work    Baseline improved    Status On-going  PT LONG TERM GOAL #3   Title She will be able to sleep without pain    Status Achieved      PT LONG TERM GOAL #4   Title She will be able to do all home tasks without pain.    Baseline Improved but still some pain    Status On-going      PT LONG TERM GOAL #5   Title FOTO score willl improve to 29% limited from 41% limited    Baseline 21% today    Status Achieved                 Plan - 10/08/19 1642    Clinical Impression Statement No pain today. Still some discomfort with stairs  but not pain and feels stable. Will probably stop next week and dicharge with HEP but will see how she is doing then.    PT Treatment/Interventions Electrical Stimulation;Cryotherapy;Moist Heat;Iontophoresis 4mg /ml Dexamethasone;Ultrasound;Therapeutic exercise;Manual techniques;Patient/family education;Passive range of motion;Dry needling;Taping    PT Next Visit Plan continue with red/black band sidelye exercise inoto and manual and try ox standing exercise again for HEP    PT Home Exercise Plan bridge with yellow band, supine clam with yellow, SKC, pirifomis stretch,  PEC exrercisre with LT leg lift           Patient will benefit from skilled therapeutic intervention in order to  improve the following deficits and impairments:  Decreased range of motion, Pain, Decreased activity tolerance, Decreased strength, Increased muscle spasms  Visit Diagnosis: Difficulty in walking, not elsewhere classified  Cramp and spasm  Pain in left hip     Problem List Patient Active Problem List   Diagnosis Date Noted  . Left hip pain 07/22/2019  . Pain in left leg 07/22/2019  . Left inguinal pain 07/22/2019  . Trochanteric bursitis of left hip 03/11/2019  . Pulsatile tinnitus of right ear 03/11/2019  . Family history of carotid artery stenosis 03/11/2019  . Vitamin D deficiency 12/15/2018  . Hyperlipidemia 11/19/2018  . Osteopenia 11/19/2018  . Screening for breast cancer 11/18/2017  . Screen for colon cancer 11/18/2017  . Atherosclerosis of aorta ([place]) 08/09/2016  . RBBB 05/24/2016  . COPD suggested by initial evaluation ([place]) 04/10/2016  . Nonspecific abnormal electrocardiogram (ECG) (EKG) 04/10/2016  . Pulsatile abdominal mass 04/10/2016  . Post-menopausal 02/23/2016  . Abdominal bruit 02/23/2016  . Microscopic hematuria 02/23/2016  . Encounter for health maintenance examination in adult 02/18/2015  . Osteoporosis 02/26/2014  . Other specified hypothyroidism 02/26/2014  . Family history of premature coronary heart disease 02/25/2013  . History of Graves' disease 02/25/2013  . Colon polyp 02/25/2013  . Tobacco use disorder 02/25/2013    Darrel Hoover  PT 10/08/2019, 5:06 PM  [facility] [address] [place], [place], [address] Phone: [phone]   Fax:  [phone]  Name: Jodi Flynn MRN: [id number] Date of Birth: 1955/11/18

## 2019-10-13 ENCOUNTER — Other Ambulatory Visit: Payer: Self-pay

## 2019-10-13 ENCOUNTER — Ambulatory Visit: Payer: No Typology Code available for payment source

## 2019-10-13 DIAGNOSIS — R252 Cramp and spasm: Secondary | ICD-10-CM

## 2019-10-13 DIAGNOSIS — M25552 Pain in left hip: Secondary | ICD-10-CM | POA: Diagnosis not present

## 2019-10-13 DIAGNOSIS — R262 Difficulty in walking, not elsewhere classified: Secondary | ICD-10-CM

## 2019-10-13 NOTE — Therapy (Signed)
[place], [place], [address] Phone: [phone]   Fax:  [phone]  Physical Therapy Treatment/Discharge  Patient Details  Name: Jodi Flynn MRN: [id number] Date of Birth: Oct 03, 1955 Referring Provider (PT): Jean Rosenthal, MD   Encounter Date: 10/13/2019   PT End of Session - 10/13/19 8299    Visit Number 8    Number of Visits 10    Date for PT Re-Evaluation 10/16/19    Authorization Type Aetna    PT Start Time [phone]    PT Stop Time 0455    PT Time Calculation (min) 40 min    Activity Tolerance Patient tolerated treatment well    Behavior During Therapy [facility] for tasks assessed/performed           Past Medical History:  Diagnosis Date  . Allergy   . Family history of premature coronary heart disease   . Grave's disease   . History of cervical cancer    s/p hysterectomy  . History of renal stone   . Hx of colonic polyps 2007  . Osteopenia    bone density scans 2015, 2020  . Tobacco use disorder   . Wears glasses     Past Surgical History:  Procedure Laterality Date  . ABDOMINAL HYSTERECTOMY     ovaries intact; age 56yo  . COLONOSCOPY  age 55, 12/12/05   Dr. Verl Blalock  . COLONOSCOPY  11/2017   normal; Dr. Wilfrid Lund  . COLPOSCOPY     age 7  . VEIN SURGERY  1/12   laser ablation left great saphenous vein , stab phlebectomy of multiple varicosities, Dr. Donnetta Hutching     There were no vitals filed for this visit.   Subjective Assessment - 10/13/19 1633    Currently in Pain? No/denies                             [facility] Adult PT Treatment/Exercise - 10/13/19 0001      Knee/Hip Exercises: Aerobic   Nustep LE L5 5 min      Knee/Hip Exercises: Supine   Bridges Limitations With PPT  30 reps                       PT Long Term Goals - 10/13/19 1653      PT LONG TERM GOAL #1   Title She will be independent with all hEP issued    Status  Achieved      PT LONG TERM GOAL #2   Title She will report no pain with walking stairs at work    Status Achieved      PT LONG TERM GOAL #3   Title She will be able to sleep without pain    Status Achieved      PT LONG TERM GOAL #4   Title She will be able to do all home tasks without pain.    Status Achieved      PT LONG TERM GOAL #5   Title FOTO score willl improve to 29% limited from 41% limited    Baseline 1% limited today    Status Achieved                 Plan - 10/13/19 1614    Clinical Impression Statement No pain nad [name] reports no limits at home as she has mowed grass and has no issues with steps at  work. She reports she is commited to her HEP and will continue on regular basis.   She felt she did not need more Pt .    PT Treatment/Interventions Electrical Stimulation;Cryotherapy;Moist Heat;Iontophoresis '4mg'$ /ml Dexamethasone;Ultrasound;Therapeutic exercise;Manual techniques;Patient/family education;Passive range of motion;Dry needling;Taping    PT Next Visit Plan Dsicharge with HEP    PT Home Exercise Plan bridge with yellow band, supine clam with yellow, SKC, pirifomis stretch,  PEC exrercisre with LT leg lift    Consulted and Agree with Plan of Care Patient           Patient will benefit from skilled therapeutic intervention in order to improve the following deficits and impairments:  Decreased range of motion, Pain, Decreased activity tolerance, Decreased strength, Increased muscle spasms  Visit Diagnosis: Difficulty in walking, not elsewhere classified  Cramp and spasm  Pain in left hip     Problem List Patient Active Problem List   Diagnosis Date Noted  . Left hip pain 07/22/2019  . Pain in left leg 07/22/2019  . Left inguinal pain 07/22/2019  . Trochanteric bursitis of left hip 03/11/2019  . Pulsatile tinnitus of right ear 03/11/2019  . Family history of carotid artery stenosis 03/11/2019  . Vitamin D deficiency 12/15/2018  .  Hyperlipidemia 11/19/2018  . Osteopenia 11/19/2018  . Screening for breast cancer 11/18/2017  . Screen for colon cancer 11/18/2017  . Atherosclerosis of aorta ([place]) 08/09/2016  . RBBB 05/24/2016  . COPD suggested by initial evaluation ([place]) 04/10/2016  . Nonspecific abnormal electrocardiogram (ECG) (EKG) 04/10/2016  . Pulsatile abdominal mass 04/10/2016  . Post-menopausal 02/23/2016  . Abdominal bruit 02/23/2016  . Microscopic hematuria 02/23/2016  . Encounter for health maintenance examination in adult 02/18/2015  . Osteoporosis 02/26/2014  . Other specified hypothyroidism 02/26/2014  . Family history of premature coronary heart disease 02/25/2013  . History of Graves' disease 02/25/2013  . Colon polyp 02/25/2013  . Tobacco use disorder 02/25/2013    Darrel Hoover  PT 10/13/2019, 4:58 PM  [facility] [address] [place], [place], [address] Phone: [phone]   Fax:  [phone]  Name: Jodi Flynn MRN: [id number] Date of Birth: 01-Aug-1955  PHYSICAL THERAPY DISCHARGE SUMMARY  Visits from Start of Care: 8  Current functional level related to goals / functional outcomes: All met    Remaining deficits: None   Education / Equipment: HEP  Plan: Patient agrees to discharge.  Patient goals were met. Patient is being discharged due to meeting the stated rehab goals.  ?????

## 2019-10-15 ENCOUNTER — Ambulatory Visit: Payer: No Typology Code available for payment source

## 2019-10-21 ENCOUNTER — Other Ambulatory Visit: Payer: Self-pay | Admitting: Medical

## 2019-11-20 ENCOUNTER — Encounter: Payer: Managed Care, Other (non HMO) | Admitting: Medical

## 2019-11-23 ENCOUNTER — Other Ambulatory Visit: Payer: Self-pay

## 2019-11-23 ENCOUNTER — Ambulatory Visit: Payer: No Typology Code available for payment source | Admitting: Medical

## 2019-11-23 ENCOUNTER — Encounter: Payer: Self-pay | Admitting: Medical

## 2019-11-23 VITALS — BP 124/80 | HR 72 | Ht 63.78 in | Wt 132.0 lb

## 2019-11-23 DIAGNOSIS — I4510 Unspecified right bundle-branch block: Secondary | ICD-10-CM

## 2019-11-23 DIAGNOSIS — Z8639 Personal history of other endocrine, nutritional and metabolic disease: Secondary | ICD-10-CM | POA: Diagnosis not present

## 2019-11-23 DIAGNOSIS — E785 Hyperlipidemia, unspecified: Secondary | ICD-10-CM

## 2019-11-23 DIAGNOSIS — Z7189 Other specified counseling: Secondary | ICD-10-CM

## 2019-11-23 DIAGNOSIS — Z0000 Encounter for general adult medical examination without abnormal findings: Secondary | ICD-10-CM

## 2019-11-23 DIAGNOSIS — Z23 Encounter for immunization: Secondary | ICD-10-CM | POA: Diagnosis not present

## 2019-11-23 DIAGNOSIS — E038 Other specified hypothyroidism: Secondary | ICD-10-CM | POA: Diagnosis not present

## 2019-11-23 DIAGNOSIS — Z8249 Family history of ischemic heart disease and other diseases of the circulatory system: Secondary | ICD-10-CM

## 2019-11-23 DIAGNOSIS — E559 Vitamin D deficiency, unspecified: Secondary | ICD-10-CM

## 2019-11-23 DIAGNOSIS — Z720 Tobacco use: Secondary | ICD-10-CM

## 2019-11-23 DIAGNOSIS — Z87891 Personal history of nicotine dependence: Secondary | ICD-10-CM | POA: Insufficient documentation

## 2019-11-23 DIAGNOSIS — I10 Essential (primary) hypertension: Secondary | ICD-10-CM

## 2019-11-23 DIAGNOSIS — I700 Atherosclerosis of aorta: Secondary | ICD-10-CM

## 2019-11-23 DIAGNOSIS — M25552 Pain in left hip: Secondary | ICD-10-CM

## 2019-11-23 DIAGNOSIS — Z780 Asymptomatic menopausal state: Secondary | ICD-10-CM

## 2019-11-23 NOTE — Progress Notes (Addendum)
Subjective:   HPI  Jodi Flynn is a 64 y.o. female who presents for Chief Complaint  Patient presents with  . Annual Exam    with fasting labs     Patient Care Team: Keifer Habib, Leward Quan as PCP - General (Family Medicine)  Dr. Wilfrid Lund, gastroenterology Dr. Jean Rosenthal, orthopedics Dr. Larae Grooms, cardiology Sees dentist Sees eye doctor  Concerns: None  Feels like she has been gaining weight of late.   Not sure if thyroid medication is off.   Been doing PT exercises at home.    Since last visit here went to [place].  Reviewed their medical, surgical, family, social, medication, and allergy history and updated chart as appropriate.  Past Medical History:  Diagnosis Date  . Allergy   . Family history of premature coronary heart disease   . Grave's disease   . History of cervical cancer    s/p hysterectomy  . History of renal stone   . Hx of colonic polyps 2007  . Osteopenia    bone density scans 2015, 2020  . Tobacco use disorder   . Wears glasses     Past Surgical History:  Procedure Laterality Date  . ABDOMINAL HYSTERECTOMY     ovaries intact; age 75yo  . COLONOSCOPY  age 13, 12/12/05   Dr. Verl Blalock  . COLONOSCOPY  11/2017   normal; Dr. Wilfrid Lund  . COLPOSCOPY     age 87  . VEIN SURGERY  1/12   laser ablation left great saphenous vein , stab phlebectomy of multiple varicosities, Dr. Donnetta Hutching     Social History   Socioeconomic History  . Marital status: Widowed    Spouse name: Not on file  . Number of children: 2  . Years of education: Not on file  . Highest education level: Not on file  Occupational History  . Occupation: Engineer, drilling for [employer]    Employer: [employer]  Tobacco Use  . Smoking status: Current Some Day Smoker    Packs/day: 0.50    Years: 42.00    Pack years: 21.00    Types: Cigarettes  . Smokeless tobacco: Never Used  Vaping Use  . Vaping Use: Never used  Substance and Sexual  Activity  . Alcohol use: Not Currently    Alcohol/week: 7.0 standard drinks    Types: 5 Glasses of wine, 2 Standard drinks or equivalent per week  . Drug use: No  . Sexual activity: Yes  Other Topics Concern  . Not on file  Social History Narrative   Widowed.  2 children, walks for exercise, hobbies: puzzles.  2 prior boyfriends committed suicide, one in front of her.  10/2019   Social Determinants of Health   Financial Resource Strain:   . Difficulty of Paying Living Expenses:   Food Insecurity:   . Worried About Charity fundraiser in the Last Year:   . Arboriculturist in the Last Year:   Transportation Needs:   . Film/video editor (Medical):   Marland Kitchen Lack of Transportation (Non-Medical):   Physical Activity:   . Days of Exercise per Week:   . Minutes of Exercise per Session:   Stress:   . Feeling of Stress :   Social Connections:   . Frequency of Communication with Friends and Family:   . Frequency of Social Gatherings with Friends and Family:   . Attends Religious Services:   . Active Member of Clubs or Organizations:   .  Attends Archivist Meetings:   Marland Kitchen Marital Status:   Intimate Partner Violence:   . Fear of Current or Ex-Partner:   . Emotionally Abused:   Marland Kitchen Physically Abused:   . Sexually Abused:     Family History  Problem Relation Age of Onset  . COPD Mother        respiratory failure  . Kidney disease Mother   . Hypertension Mother   . Hyperlipidemia Mother   . Heart disease Father        died of MI, late 75s  . Hypertension Father   . Hyperlipidemia Father   . Stroke Father   . Hypertension Brother   . Hyperlipidemia Brother   . Kidney disease Brother        kidney stones  . Diabetes Brother        borderline  . Hyperlipidemia Brother   . Hypertension Brother   . Cancer Neg Hx   . Colon cancer Neg Hx   . Esophageal cancer Neg Hx   . Rectal cancer Neg Hx   . Stomach cancer Neg Hx      Current Outpatient Medications:  .  aspirin  EC 81 MG tablet, Take 1 tablet (81 mg total) by mouth daily., Disp: 90 tablet, Rfl: 3 .  cholecalciferol (VITAMIN D3) 25 MCG (1000 UT) tablet, Take 1 tablet (1,000 Units total) by mouth daily., Disp: 90 tablet, Rfl: 3 .  Cyanocobalamin (VITAMIN B 12 PO), Take by mouth., Disp: , Rfl:  .  fish oil-omega-3 fatty acids 1000 MG capsule, Take 2 g by mouth daily. , Disp: , Rfl:  .  HYDROcodone-acetaminophen (NORCO) 5-325 MG tablet, Take 1 tablet by mouth every 6 (six) hours as needed., Disp: 12 tablet, Rfl: 0 .  levothyroxine (SYNTHROID) 112 MCG tablet, Take 1 tablet by mouth once daily, Disp: 90 tablet, Rfl: 0 .  lovastatin (MEVACOR) 10 MG tablet, Take 1 tablet (10 mg total) by mouth at bedtime., Disp: 90 tablet, Rfl: 3 .  Multiple Vitamin (MULTIVITAMIN WITH MINERALS) TABS, Take 1 tablet by mouth daily., Disp: , Rfl:  .  nicotine (NICODERM CQ - DOSED IN MG/24 HR) 7 mg/24hr patch, Place 7 mg onto the skin daily., Disp: , Rfl:  .  olmesartan-hydrochlorothiazide (BENICAR HCT) 20-12.5 MG tablet, Take 1 tablet by mouth once daily, Disp: 90 tablet, Rfl: 1  Allergies  Allergen Reactions  . Pravachol [Pravastatin Sodium]     Fatigue, weight loss     Review of Systems Constitutional: -fever, -chills, -sweats, -unexpected weight change, -decreased appetite, -fatigue Allergy: -sneezing, -itching, -congestion Dermatology: -changing moles, --rash, -lumps ENT: -runny nose, -ear pain, -sore throat, -hoarseness, -sinus pain, -teeth pain, - ringing in ears, -hearing loss, -nosebleeds Cardiology: -chest pain, -palpitations, -swelling, -difficulty breathing when lying flat, -waking up short of breath Respiratory: -cough, -shortness of breath, -difficulty breathing with exercise or exertion, -wheezing, -coughing up blood Gastroenterology: -abdominal pain, -nausea, -vomiting, -diarrhea, -constipation, -blood in stool, -changes in bowel movement, -difficulty swallowing or eating Hematology: -bleeding, -bruising   Musculoskeletal: -joint aches, -muscle aches, -joint swelling, -back pain, -neck pain, -cramping, -changes in gait Ophthalmology: denies vision changes, eye redness, itching, discharge Urology: -burning with urination, -difficulty urinating, -blood in urine, -urinary frequency, -urgency, -incontinence Neurology: -headache, -weakness, -tingling, -numbness, -memory loss, -falls, -dizziness Psychology: -depressed mood, -agitation, -sleep problems Breast/gyn: -breast tenderness, -discharge, -lumps, -vaginal discharge,- irregular periods, -heavy periods     Objective:  BP 124/80   Pulse 72   Ht 5' 3.78" (1.62  m)   Wt 132 lb (59.9 kg)   SpO2 95%   BMI 22.81 kg/m    Wt Readings from Last 3 Encounters:  11/23/19 132 lb (59.9 kg)  08/12/19 126 lb (57.2 kg)  07/30/19 126 lb (57.2 kg)   BP Readings from Last 3 Encounters:  11/23/19 124/80  07/30/19 120/70  07/22/19 128/70   General appearance: alert, no distress, WD/WN, lean white female  Skin: right preauricular region with 2 separate raised flesh colored 40mm papules, left ear tragus with vertical fleshy appendage skin that she notes has been unchanged for years, solar keratosis of bilat forearms and hands , scattered cherry hemangiomas of torso Neck: supple, no lymphadenopathy, no thyromegaly, no masses, normal ROM, no bruits  Chest: non tender, normal shape and expansion  Heart: RRR, normal S1, S2, no murmurs  Lungs: CTA bilaterally, no wheezes, rhonchi, or rales  Abdomen: +bs, soft, non tender, non distended, no masses, no hepatomegaly, no splenomegaly, faint bruit over mid aorta of abdomen Back: non tender, normal ROM, no scoliosis  Musculoskeletal: mild bony arthritic changes of bilat 5th finger PIP, right 4th toe MTP with bony arthritis change, right MCP of thumb with bony arthritic change, otherwise upper extremities non tender, no obvious deformity, normal ROM throughout, lower extremities non tender, no obvious deformity, normal  ROM throughout  Extremities: no edema, no cyanosis, no clubbing  Pulses: 1+ symmetric, upper  extremities, normal cap refill , pedal pulses 1+ Neurological: alert, oriented x 3, CN2-12 intact, strength normal upper extremities and lower extremities, sensation normal throughout, DTRs 2+ throughout, no cerebellar signs, gait normal  Psychiatric: normal affect, behavior normal, pleasant  Breast, gyn, rectal - declined today   Assessment and Plan :   Encounter Diagnoses  Name Primary?  . Encounter for health maintenance examination in adult Yes  . History of Graves' disease   . Family history of premature coronary heart disease   . Other specified hypothyroidism   . Atherosclerosis of aorta ([place])   . RBBB   . Left hip pain   . Family history of carotid artery stenosis   . Vitamin D deficiency   . Hyperlipidemia, unspecified hyperlipidemia type   . Post-menopausal   . Need for pneumococcal vaccination   . Tobacco use   . Essential hypertension, benign   . Counseling regarding advanced directives     Physical exam - discussed and counseled on healthy lifestyle, diet, exercise, preventative care, vaccinations, sick and well care, proper use of emergency dept and after hours care, and addressed their concerns.    Health screening: Advised they see their eye doctor yearly for routine vision care. Advised they see their dentist yearly for routine dental care including hygiene visits twice yearly. Bone density 09/2018 reviewed showing osteopenia.   Cancer screening Mammogram from June 2020 reviewed, normal  Colonoscopy from August 2019 reviewed, normal.  Repeat in 10 years, Dr. Wilfrid Lund  S/p hysterectomy, pelvic exam periodically  Counseled on self breast exams, mammograms    Vaccinations: Advised yearly influenza vaccine Up to date on Shingrix, Tdap Up to date on Covid vaccine Up to date on Pneucooccal 23 vaccine  Counseled on the pneumococcal vaccine.  Vaccine  information sheet given.  Pneumococcal vaccine Prevnar 13 given after consent obtained.   Acute issues discussed: none   Separate significant chronic issues discussed: Premature CAD-continue primary prevention.  I reviewed her cardiology consult from 2018, Dr. Irish Lack.  She had a new finding of right bundle branch block, family history  of premature CAD, and at that time they felt like she needed to just have primary prevention and no other testing.  However she did have subsequent visit this past year 2020 had a negative stress test, she also had myocardial perfusion imaging it was normal, 79% ejection fraction, low risk stress test.  History of abdominal aortic aneurysm screen 2018 showing normal tapering, no aneurysm but did have atherosclerotic plaque  Atherosclerosis of the abdominal aorta-continue statin, primary prevention  Hyperlipidemia-continue current medication  Tobacco use-she had quit in 2018 but with the pandemic she started back.  Encouraged her to quit completely  Prior mildly abnormal PFT - given hx/o tobacco use long term, consider CT chest  Hypertension - 2020 diagnosis per cardiology, c/t current medication  Hypothyroidism-continue current medication, labs today  Osteopenia, postmenopausal-reviewed the 2020 bone density scan.  Vitamin D deficiency-labs today, continue supplement   Discussed advanced directives.    Tania was seen today for annual exam.  Diagnoses and all orders for this visit:  Encounter for health maintenance examination in adult -     Comprehensive metabolic panel -     CBC -     Lipid panel -     TSH -     T4, free -     VITAMIN D 25 Hydroxy (Vit-D Deficiency, Fractures)  History of Graves' disease  Family history of premature coronary heart disease  Other specified hypothyroidism -     TSH -     T4, free  Atherosclerosis of aorta ([facility])  RBBB  Left hip pain  Family history of carotid artery stenosis  Vitamin D  deficiency -     VITAMIN D 25 Hydroxy (Vit-D Deficiency, Fractures)  Hyperlipidemia, unspecified hyperlipidemia type -     Lipid panel  Post-menopausal  Need for pneumococcal vaccination  Tobacco use  Essential hypertension, benign  Counseling regarding advanced directives  Other orders -     Pneumococcal conjugate vaccine 13-valent    Follow-up pending labs, yearly for physical

## 2019-11-23 NOTE — Addendum Note (Signed)
Addended by: Edgar Frisk on: 11/23/2019 09:55 AM   Modules accepted: Orders

## 2019-11-24 LAB — CBC
Hematocrit: 39.7 % (ref 34.0–46.6)
Hemoglobin: 13.3 g/dL (ref 11.1–15.9)
MCH: 32.1 pg (ref 26.6–33.0)
MCHC: 33.5 g/dL (ref 31.5–35.7)
MCV: 96 fL (ref 79–97)
Platelets: 284 10*3/uL (ref 150–450)
RBC: 4.14 x10E6/uL (ref 3.77–5.28)
RDW: 12.1 % (ref 11.7–15.4)
WBC: 6.4 10*3/uL (ref 3.4–10.8)

## 2019-11-24 LAB — COMPREHENSIVE METABOLIC PANEL
ALT: 11 IU/L (ref 0–32)
AST: 15 IU/L (ref 0–40)
Albumin/Globulin Ratio: 2.2 (ref 1.2–2.2)
Albumin: 4.2 g/dL (ref 3.8–4.8)
Alkaline Phosphatase: 40 IU/L — ABNORMAL LOW (ref 48–121)
BUN/Creatinine Ratio: 22 (ref 12–28)
BUN: 19 mg/dL (ref 8–27)
Bilirubin Total: 0.3 mg/dL (ref 0.0–1.2)
CO2: 24 mmol/L (ref 20–29)
Calcium: 9.5 mg/dL (ref 8.7–10.3)
Chloride: 102 mmol/L (ref 96–106)
Creatinine, Ser: 0.86 mg/dL (ref 0.57–1.00)
GFR calc Af Amer: 82 mL/min/{1.73_m2} (ref >59)
GFR calc non Af Amer: 71 mL/min/{1.73_m2} (ref >59)
Globulin, Total: 1.9 g/dL (ref 1.5–4.5)
Glucose: 99 mg/dL (ref 65–99)
Potassium: 4.8 mmol/L (ref 3.5–5.2)
Sodium: 140 mmol/L (ref 134–144)
Total Protein: 6.1 g/dL (ref 6.0–8.5)

## 2019-11-24 LAB — TSH: TSH: 0.992 u[IU]/mL (ref 0.450–4.500)

## 2019-11-24 LAB — T4, FREE: Free T4: 1.89 ng/dL — ABNORMAL HIGH (ref 0.82–1.77)

## 2019-11-24 LAB — LIPID PANEL
Chol/HDL Ratio: 1.9 ratio (ref 0.0–4.4)
Cholesterol, Total: 171 mg/dL (ref 100–199)
HDL: 90 mg/dL (ref >39)
LDL Chol Calc (NIH): 70 mg/dL (ref 0–99)
Triglycerides: 53 mg/dL (ref 0–149)
VLDL Cholesterol Cal: 11 mg/dL (ref 5–40)

## 2019-11-24 LAB — VITAMIN D 25 HYDROXY (VIT D DEFICIENCY, FRACTURES): Vit D, 25-Hydroxy: 19.5 ng/mL — ABNORMAL LOW (ref 30.0–100.0)

## 2019-11-25 ENCOUNTER — Other Ambulatory Visit: Payer: Self-pay | Admitting: Medical

## 2019-11-25 DIAGNOSIS — E039 Hypothyroidism, unspecified: Secondary | ICD-10-CM

## 2019-11-25 MED ORDER — VITAMIN D 25 MCG (1000 UNIT) PO TABS
2000.0000 [IU] | ORAL_TABLET | Freq: Every day | ORAL | 3 refills | Status: DC
Start: 2019-11-25 — End: 2021-01-20

## 2019-11-25 MED ORDER — LOVASTATIN 10 MG PO TABS: 10.0000 mg | ORAL_TABLET | Freq: Every day | ORAL | 3 refills | Status: DC

## 2019-11-25 MED ORDER — ASPIRIN EC 81 MG PO TBEC: 81.0000 mg | DELAYED_RELEASE_TABLET | Freq: Every day | ORAL | 3 refills | Status: DC

## 2019-11-25 MED ORDER — LEVOTHYROXINE SODIUM 100 MCG PO TABS
100.0000 ug | ORAL_TABLET | Freq: Every day | ORAL | 2 refills | Status: DC
Start: 2019-11-25 — End: 2020-02-17

## 2019-11-25 MED ORDER — OLMESARTAN MEDOXOMIL-HCTZ 20-12.5 MG PO TABS: 1.0000 | ORAL_TABLET | Freq: Every day | ORAL | 3 refills | Status: DC

## 2020-01-08 ENCOUNTER — Other Ambulatory Visit: Payer: Self-pay

## 2020-01-08 ENCOUNTER — Other Ambulatory Visit: Payer: No Typology Code available for payment source

## 2020-01-08 DIAGNOSIS — E039 Hypothyroidism, unspecified: Secondary | ICD-10-CM

## 2020-01-09 LAB — TSH: TSH: 2.32 u[IU]/mL (ref 0.450–4.500)

## 2020-02-14 ENCOUNTER — Other Ambulatory Visit: Payer: Self-pay | Admitting: Medical

## 2020-02-16 NOTE — Telephone Encounter (Signed)
Is this appropriate?  

## 2020-06-23 ENCOUNTER — Encounter: Payer: Self-pay | Admitting: Medical

## 2020-06-23 ENCOUNTER — Other Ambulatory Visit: Payer: Self-pay

## 2020-06-23 ENCOUNTER — Telehealth: Payer: Self-pay | Admitting: Medical

## 2020-06-23 ENCOUNTER — Ambulatory Visit: Payer: No Typology Code available for payment source | Admitting: Medical

## 2020-06-23 VITALS — BP 130/82 | HR 66 | Ht 64.0 in | Wt 137.0 lb

## 2020-06-23 DIAGNOSIS — M7632 Iliotibial band syndrome, left leg: Secondary | ICD-10-CM | POA: Diagnosis not present

## 2020-06-23 DIAGNOSIS — M7662 Achilles tendinitis, left leg: Secondary | ICD-10-CM | POA: Insufficient documentation

## 2020-06-23 DIAGNOSIS — M7062 Trochanteric bursitis, left hip: Secondary | ICD-10-CM

## 2020-06-23 DIAGNOSIS — M79644 Pain in right finger(s): Secondary | ICD-10-CM | POA: Insufficient documentation

## 2020-06-23 DIAGNOSIS — M71331 Other bursal cyst, right wrist: Secondary | ICD-10-CM

## 2020-06-23 DIAGNOSIS — M71339 Other bursal cyst, unspecified wrist: Secondary | ICD-10-CM | POA: Insufficient documentation

## 2020-06-23 NOTE — Progress Notes (Signed)
Subjective: Chief Complaint  Patient presents with  . Wrist Pain    Right wrist has a knot   . Leg Pain    Left thigh pain    Here for pains  Here for new lumps of right wrist.  Awoke one morning and the lump just appeared few weeks ago.  Hurts to move wrist and thumb, hurts with extension of wrist or movement with thumb.  Hurts to open soda cans or other items.  At work reaches and pulls files, and by end of the days hurts all through wrist and thumb.   No injury, no trauma.   Left handed.  But does a lot with right hand too.  Has left thigh pain.   Having pain again in left hip and thigh.   Sits a lot at work.   No injury, no trauma.   Work would allow stand up desk or different ergonomic chair.   Doesn't use stairs much but at home.   Some squatting on the job, but not often.   No other aggravating or relieving factors. No other complaint.    Objective BP 130/82   Pulse 66   Ht 5\' 4"  (1.626 m)   Wt 137 lb (62.1 kg)   SpO2 95%   BMI 23.52 kg/m   Gen: wd, wn, nad Skin unremarkable Right wrist along the medial aspect of the olecranon process at the distal ulna is a spongy 5 to 6 mm diameter soft tissue cystic lesion that is nontender.  There is a 1 cm diameter cystic roundish lesion similar to the other lesion along the medial aspect of the proximal fifth metacarpal, it does cause discomfort when she medially inverts the hand, she is tender at the base of the thumb and pain in the same base of the thumb area increases with grasping and squeezing items with the thumb and hand, no obvious swelling or laxity of the thumb, rest of hands unremarkable Hands and arms neurovascularly intact Tender over left trochanteric bursa and left IT band, tender if left leg is extended without support lying on her right side, otherwise leg nontender, no swelling or other deformity    Assessment: Encounter Diagnoses  Name Primary?  . Thumb pain, right Yes  . Other bursal cyst of right wrist   .  Trochanteric bursitis of left hip   . It band syndrome, left      Plan: Discussed findings.  Reviewed ortho and PT notes from last year where she was treated for similar IT band and hip bursitis issues.   She did get some improvement, but lately having problems all over again and new hand issues.  I suspect the new cystic lesions are ganglion cyst or synovial cysts of the right wrist  Recommendations  Right thumb pain-possibly arthritis, possibly tendinitis.  Begin using a thumb spica splint over-the-counter for the next 7 to 10 days.  This will help rest and splint the thumb  Begin Aleve over-the-counter 1 to 2 tablets either once daily or twice daily for the next week.  This is for the thumb pain and inflammation as well as the left hip and leg inflammation.  You can use ice therapy or bag of frozen peas to the thumb and similarly to the left hip on and off for 20 minutes at a time for pain and inflammation  I will send message to orthopedics to get you back in for further evaluation and treatment recommendations for these issues, particularly the left hip  pain cyst of the wrist  We wrote a letter today to ask her employer for stand up desk or ergonomic chair  Continue your home exercises that therapy gave you last year for the bursitis and IT band inflammation   Jodi Flynn was seen today for wrist pain and leg pain.  Diagnoses and all orders for this visit:  Thumb pain, right  Other bursal cyst of right wrist  Trochanteric bursitis of left hip  It band syndrome, left  f/u with ortho

## 2020-06-23 NOTE — Telephone Encounter (Signed)
See form.  I sent the separate amlodipine valsartan  combo and separate hydrochlorothiazide since the triple combo was not available

## 2020-06-23 NOTE — Telephone Encounter (Signed)
Ignore previous message that was sent in error

## 2020-06-23 NOTE — Patient Instructions (Signed)
Recommendations  Right thumb pain-possibly arthritis, possibly tendinitis.  Begin using a thumb spica splint over-the-counter for the next 7 to 10 days.  This will help rest and splint the thumb  Begin Aleve over-the-counter 1 to 2 tablets either once daily or twice daily for the next week.  This is for the thumb pain and inflammation as well as the left hip and leg inflammation.  You can use ice therapy or bag of frozen peas to the thumb and similarly to the left hip on and off for 20 minutes at a time for pain and inflammation  I will send message to orthopedics to get you back in for further evaluation and treatment recommendations for these issues, particularly the left hip pain cyst of the wrist  We wrote a letter today to ask her employer for stand up desk or ergonomic chair  Continue your home exercises that therapy gave you last year for the bursitis and IT band inflammation

## 2020-07-11 ENCOUNTER — Other Ambulatory Visit: Payer: Self-pay | Admitting: Medical

## 2020-10-24 ENCOUNTER — Other Ambulatory Visit: Payer: Self-pay | Admitting: Medical

## 2020-12-09 ENCOUNTER — Encounter: Payer: Self-pay | Admitting: Internal Medicine

## 2020-12-13 ENCOUNTER — Other Ambulatory Visit: Payer: Self-pay

## 2020-12-13 MED ORDER — LEVOTHYROXINE SODIUM 100 MCG PO TABS: 100.0000 ug | ORAL_TABLET | Freq: Every day | ORAL | 0 refills | Status: DC

## 2020-12-17 ENCOUNTER — Other Ambulatory Visit: Payer: Self-pay | Admitting: Medical

## 2020-12-19 NOTE — Telephone Encounter (Signed)
Has upcoming appointment

## 2021-01-17 ENCOUNTER — Ambulatory Visit
Admission: RE | Admit: 2021-01-17 | Discharge: 2021-01-17 | Disposition: A | Discharge status: Home / Self Care | Payer: No Typology Code available for payment source | Source: Ambulatory Visit | Attending: Medical | Admitting: Medical

## 2021-01-17 ENCOUNTER — Ambulatory Visit: Payer: No Typology Code available for payment source | Admitting: Medical

## 2021-01-17 ENCOUNTER — Other Ambulatory Visit: Payer: Self-pay

## 2021-01-17 ENCOUNTER — Encounter: Payer: Self-pay | Admitting: Medical

## 2021-01-17 VITALS — BP 128/78 | HR 81 | Ht 68.0 in | Wt 133.0 lb

## 2021-01-17 DIAGNOSIS — Z0000 Encounter for general adult medical examination without abnormal findings: Secondary | ICD-10-CM

## 2021-01-17 DIAGNOSIS — I700 Atherosclerosis of aorta: Secondary | ICD-10-CM

## 2021-01-17 DIAGNOSIS — M5442 Lumbago with sciatica, left side: Secondary | ICD-10-CM

## 2021-01-17 DIAGNOSIS — Z136 Encounter for screening for cardiovascular disorders: Secondary | ICD-10-CM | POA: Insufficient documentation

## 2021-01-17 DIAGNOSIS — Z87891 Personal history of nicotine dependence: Secondary | ICD-10-CM

## 2021-01-17 DIAGNOSIS — I10 Essential (primary) hypertension: Secondary | ICD-10-CM | POA: Diagnosis not present

## 2021-01-17 DIAGNOSIS — G8929 Other chronic pain: Secondary | ICD-10-CM

## 2021-01-17 DIAGNOSIS — E785 Hyperlipidemia, unspecified: Secondary | ICD-10-CM

## 2021-01-17 DIAGNOSIS — M5410 Radiculopathy, site unspecified: Secondary | ICD-10-CM

## 2021-01-17 DIAGNOSIS — Z780 Asymptomatic menopausal state: Secondary | ICD-10-CM

## 2021-01-17 DIAGNOSIS — E559 Vitamin D deficiency, unspecified: Secondary | ICD-10-CM

## 2021-01-17 DIAGNOSIS — Z8639 Personal history of other endocrine, nutritional and metabolic disease: Secondary | ICD-10-CM | POA: Diagnosis not present

## 2021-01-17 DIAGNOSIS — Z8249 Family history of ischemic heart disease and other diseases of the circulatory system: Secondary | ICD-10-CM | POA: Diagnosis not present

## 2021-01-17 DIAGNOSIS — I4510 Unspecified right bundle-branch block: Secondary | ICD-10-CM

## 2021-01-17 LAB — HM MAMMOGRAPHY

## 2021-01-17 NOTE — Progress Notes (Signed)
Subjective:   HPI  Jodi Flynn is a 65 y.o. female who presents for Chief Complaint  Patient presents with   nonfasting cpe    Nonfasting cpe, having some pain at thigh down to knee. [facility] mammogram [facility] at [facility]    Patient Care Team: Annalynne Ibanez, Leward Quan as PCP - General (Family Medicine)  Dr. Wilfrid Lund, gastroenterology Dr. Jean Rosenthal, orthopedics Dr. Larae Grooms, cardiology Sees dentist Sees eye doctor  Concerns: Having some pains in the left lateral side.  No injury or trauma.  Hurts when she puts pressure on that area such as lying on that side.  No fall.  No numbness or tingling in the leg.  She does have some low back pain though  She continues to smoke  Had her mammogram today  Had COVID around [place] day weekend and never got sick.  She tested positive after some travel  She is nonfasting today as she ate a double cheeseburger earlier today  Reviewed their medical, surgical, family, social, medication, and allergy history and updated chart as appropriate.  Past Medical History:  Diagnosis Date   Allergy    Family history of premature coronary heart disease    Grave's disease    History of cervical cancer    s/p hysterectomy   History of renal stone    Hx of colonic polyps 2007   Osteopenia    bone density scans 2015, 2020   Tobacco use disorder    Wears glasses     Past Surgical History:  Procedure Laterality Date   ABDOMINAL HYSTERECTOMY     ovaries intact; age 68yo   COLONOSCOPY  age 7, 12/12/05   Dr. Verl Blalock   COLONOSCOPY  11/2017   normal; Dr. Wilfrid Lund   COLPOSCOPY     age 68   VEIN SURGERY  1/12   laser ablation left great saphenous vein , stab phlebectomy of multiple varicosities, Dr. Donnetta Hutching     Social History   Socioeconomic History   Marital status: Widowed    Spouse name: Not on file   Number of children: 2   Years of education: Not on file   Highest education level: Not on file  Occupational  History   Occupation: Engineer, drilling for [employer]    Employer: [employer]  Tobacco Use   Smoking status: Some Days    Packs/day: 0.50    Years: 42.00    Pack years: 21.00    Types: Cigarettes   Smokeless tobacco: Never  Vaping Use   Vaping Use: Never used  Substance and Sexual Activity   Alcohol use: Not Currently    Alcohol/week: 7.0 standard drinks    Types: 5 Glasses of wine, 2 Standard drinks or equivalent per week   Drug use: No   Sexual activity: Yes  Other Topics Concern   Not on file  Social History Narrative   Widowed.  2 children, walks for exercise, hobbies: puzzles.  2 prior boyfriends committed suicide, one in front of her.  10/2019   Social Determinants of Health   Financial Resource Strain: Not on file  Food Insecurity: Not on file  Transportation Needs: Not on file  Physical Activity: Not on file  Stress: Not on file  Social Connections: Not on file  Intimate Partner Violence: Not on file    Family History  Problem Relation Age of Onset   COPD Mother        respiratory failure   Kidney disease Mother  Hypertension Mother    Hyperlipidemia Mother    Heart disease Father        died of MI, late 60s   Hypertension Father    Hyperlipidemia Father    Stroke Father    Hypertension Brother    Hyperlipidemia Brother    Kidney disease Brother        kidney stones   Diabetes Brother        borderline   Hyperlipidemia Brother    Hypertension Brother    Cancer Neg Hx    Colon cancer Neg Hx    Esophageal cancer Neg Hx    Rectal cancer Neg Hx    Stomach cancer Neg Hx      Current Outpatient Medications:    aspirin EC 81 MG tablet, Take 1 tablet (81 mg total) by mouth daily., Disp: 90 tablet, Rfl: 3   cholecalciferol (VITAMIN D3) 25 MCG (1000 UNIT) tablet, Take 2 tablets (2,000 Units total) by mouth daily., Disp: 180 tablet, Rfl: 3   Cyanocobalamin (VITAMIN B 12 PO), Take by mouth., Disp: , Rfl:    fish oil-omega-3 fatty acids 1000 MG  capsule, Take 2 g by mouth daily., Disp: , Rfl:    levothyroxine (EUTHYROX) 100 MCG tablet, Take 1 tablet (100 mcg total) by mouth daily., Disp: 52 tablet, Rfl: 0   lovastatin (MEVACOR) 10 MG tablet, TAKE 1 TABLET BY MOUTH AT BEDTIME, Disp: 90 tablet, Rfl: 0   Multiple Vitamin (MULTIVITAMIN WITH MINERALS) TABS, Take 1 tablet by mouth daily., Disp: , Rfl:    olmesartan-hydrochlorothiazide (BENICAR HCT) 20-12.5 MG tablet, Take 1 tablet by mouth once daily, Disp: 90 tablet, Rfl: 0  Allergies  Allergen Reactions   Pravachol [Pravastatin Sodium]     Fatigue, weight loss     Review of Systems Constitutional: -fever, -chills, -sweats, -unexpected weight change, -decreased appetite, -fatigue Allergy: -sneezing, -itching, -congestion Dermatology: -changing moles, --rash, -lumps ENT: -runny nose, -ear pain, -sore throat, -hoarseness, -sinus pain, -teeth pain, - ringing in ears, -hearing loss, -nosebleeds Cardiology: -chest pain, -palpitations, -swelling, -difficulty breathing when lying flat, -waking up short of breath Respiratory: -cough, -shortness of breath, -difficulty breathing with exercise or exertion, -wheezing, -coughing up blood Gastroenterology: -abdominal pain, -nausea, -vomiting, -diarrhea, -constipation, -blood in stool, -changes in bowel movement, -difficulty swallowing or eating Hematology: -bleeding, -bruising  Musculoskeletal: +joint aches, +muscle aches, -joint swelling, -back pain, -neck pain, -cramping, -changes in gait Ophthalmology: denies vision changes, eye redness, itching, discharge Urology: -burning with urination, -difficulty urinating, -blood in urine, -urinary frequency, -urgency, -incontinence Neurology: -headache, -weakness, -tingling, -numbness, -memory loss, -falls, -dizziness Psychology: -depressed mood, -agitation, -sleep problems Breast/gyn: -breast tenderness, -discharge, -lumps, -vaginal discharge,- irregular periods, -heavy periods     Objective:  BP  128/78   Pulse 81   Ht 5\' 8"  (1.727 m)   Wt 133 lb (60.3 kg)   BMI 20.22 kg/m    Wt Readings from Last 3 Encounters:  01/17/21 133 lb (60.3 kg)  06/23/20 137 lb (62.1 kg)  11/23/19 132 lb (59.9 kg)   BP Readings from Last 3 Encounters:  01/17/21 128/78  06/23/20 130/82  11/23/19 124/80   General appearance: alert, no distress, WD/WN, lean white female  Skin: right preauricular region with 2 separate raised flesh colored 29mm papules, left ear tragus with vertical fleshy appendage skin that she notes has been unchanged for years, solar keratosis of bilat forearms and hands , scattered cherry hemangiomas of torso Neck: supple, no lymphadenopathy, no thyromegaly, no masses, normal ROM,  no bruits  Chest: non tender, normal shape and expansion  Heart: RRR, normal S1, S2, no murmurs  Lungs: CTA bilaterally, no wheezes, rhonchi, or rales  Abdomen: +bs, soft, non tender, non distended, no masses, no hepatomegaly, no splenomegaly, faint bruit over mid aorta of abdomen Back: non tender, normal ROM, no scoliosis  Musculoskeletal: Nontender but she does have some pain in the lateral left upper thigh with external range of motion which is full, otherwise mild bony arthritic changes of bilat 5th finger PIP, right 4th toe MTP with bony arthritis change, right MCP of thumb with bony arthritic change, otherwise upper extremities non tender, no obvious deformity, normal ROM throughout, lower extremities non tender, no obvious deformity, normal ROM throughout  Extremities: no edema, no cyanosis, no clubbing  Pulses: 1+ symmetric, upper  extremities, normal cap refill , pedal pulses 1+ Neurological: alert, oriented x 3, CN2-12 intact, strength normal upper extremities and lower extremities, sensation normal throughout, DTRs 2+ throughout, no cerebellar signs, gait normal  Psychiatric: normal affect, behavior normal, pleasant  Breast, gyn, rectal - declined today   Assessment and Plan :   Encounter  Diagnoses  Name Primary?   Encounter for health maintenance examination in adult Yes   Essential hypertension, benign    Family history of carotid artery stenosis    Family history of premature coronary heart disease    Former smoker    History of Graves' disease    Hyperlipidemia, unspecified hyperlipidemia type    Atherosclerosis of aorta (HCC)    Vitamin D deficiency    RBBB    Post-menopausal    Screening for heart disease    Radicular pain of left lower extremity    Chronic left-sided low back pain with left-sided sciatica     Physical exam - discussed and counseled on healthy lifestyle, diet, exercise, preventative care, vaccinations, sick and well care, proper use of emergency dept and after hours care, and addressed their concerns.    Health screening: Advised they see their eye doctor yearly for routine vision care. Advised they see their dentist yearly for routine dental care including hygiene visits twice yearly.  Bone density 09/2018 reviewed showing osteopenia. Due for repeat now.   Cancer screening Mammogram pending from yesterday  Colonoscopy from August 2019 reviewed, normal.  Repeat in 10 years, Dr. Wilfrid Lund  S/p hysterectomy, pelvic exam periodically. She declines today  Counseled on self breast exams, mammograms  CT coronary calcium score ordered which will help [place] to further evaluate the lungs as well for lung cancer screening    Vaccinations: Immunization History  Administered Date(s) Administered   DTaP 11/13/2005   Influenza Split 01/29/2014, 01/25/2015   Influenza Whole 02/02/2013   Influenza, High Dose Seasonal PF 01/09/2021   Influenza,inj,Quad PF,6+ Mos 01/28/2019   Influenza-Unspecified 03/08/2016   PFIZER(Purple Top)SARS-COV-2 Vaccination 07/15/2019, 08/10/2019, 02/14/2020   Pneumococcal Conjugate-13 11/23/2019   Pneumococcal Polysaccharide-23 02/15/2012   Tdap 07/19/2012   Zoster Recombinat (Shingrix) 08/09/2016, 09/10/2016      Advised yearly influenza vaccine Up to date on Shingrix, Tdap Up to date on Covid vaccine Up to date on Pneumococcal 23 vaccine    Acute issues discussed: Left leg and back pain - advised baseline lumbar spine xray.   She has had prior treatment for left thigh pain and hip pain.  X-ray from hip from last year reviewed which showed mild degenerative changes.   Separate significant chronic issues discussed: Premature CAD-continue primary prevention.  I reviewed her cardiology consult from  2018, Dr. Irish Lack.  She had a finding of right bundle branch block, family history of premature CAD, and at that time they felt like she needed to just have primary prevention and no other testing.  However she did have subsequent visit this past year 2020 had a negative stress test, she also had myocardial perfusion imaging it was normal, 79% ejection fraction, low risk stress test.  We will refer for coronary CT calcium score which should also be able to get a picture of the lungs as well  History of abdominal aortic aneurysm screen 2018 showing normal tapering, no aneurysm but did have atherosclerotic plaque  Atherosclerosis of the abdominal aorta-continue statin, primary prevention  Hyperlipidemia-continue current medication  Tobacco use-she had quit in 2018 but with the pandemic she started back.  Encouraged her to quit completely  Hypertension - 2020 diagnosis per cardiology, c/t current medication  Hypothyroidism-continue current medication, labs today  Osteopenia, postmenopausal-reviewed the 2020 bone density scan.  She will call and schedule updated bone density scan at this time  Vitamin D deficiency-labs today, continue supplement    Jodi Flynn was seen today for nonfasting cpe.  Diagnoses and all orders for this visit:  Encounter for health maintenance examination in adult -     DG Bone Density; Future -     CT CARDIAC SCORING ([facility] LOCATIONS ONLY); Future -     DG Lumbar Spine  Complete; Future -     Comprehensive metabolic panel; Future -     Lipid panel; Future -     CBC; Future -     TSH + free T4; Future -     VITAMIN D 25 Hydroxy (Vit-D Deficiency, Fractures); Future  Essential hypertension, benign  Family history of carotid artery stenosis  Family history of premature coronary heart disease  Former smoker  History of Graves' disease -     TSH + free T4; Future  Hyperlipidemia, unspecified hyperlipidemia type -     Lipid panel; Future  Atherosclerosis of aorta (HCC) -     Lipid panel; Future  Vitamin D deficiency -     VITAMIN D 25 Hydroxy (Vit-D Deficiency, Fractures); Future  RBBB  Post-menopausal -     DG Bone Density; Future  Screening for heart disease -     CT CARDIAC SCORING ([facility] LOCATIONS ONLY); Future  Radicular pain of left lower extremity -     DG Lumbar Spine Complete; Future  Chronic left-sided low back pain with left-sided sciatica -     DG Lumbar Spine Complete; Future   Follow-up pending labs, yearly for physical

## 2021-01-17 NOTE — Patient Instructions (Signed)
Please go to [facility] for your back xray.   Their hours are 8am - 4:30 pm Monday - Friday.  Take your insurance card with you.  [facility] [phone]  [address], [place], [place] [address]  [address], [place] [address]    Call [facility] to schedule your bone density test  [facility] [address] [place], [place], [place] [address] [phone]

## 2021-01-18 ENCOUNTER — Other Ambulatory Visit: Payer: Self-pay | Admitting: Medical

## 2021-01-18 ENCOUNTER — Telehealth: Payer: Self-pay | Admitting: Medical

## 2021-01-18 DIAGNOSIS — G8929 Other chronic pain: Secondary | ICD-10-CM

## 2021-01-18 DIAGNOSIS — M5410 Radiculopathy, site unspecified: Secondary | ICD-10-CM

## 2021-01-18 MED ORDER — LOVASTATIN 20 MG PO TABS: 20.0000 mg | ORAL_TABLET | Freq: Every day | ORAL | 3 refills | Status: DC

## 2021-01-18 NOTE — Telephone Encounter (Signed)
Pt called and she read your notes about changing her statin and she would prefer to stay on same medication  (lovastatin) and just increase dose if needed since she does not have any issues from this medication. I told her that I would make you aware and you would advise after you receive her lab results from tomorrow

## 2021-01-19 ENCOUNTER — Telehealth: Payer: Self-pay

## 2021-01-19 ENCOUNTER — Other Ambulatory Visit: Payer: Self-pay

## 2021-01-19 ENCOUNTER — Other Ambulatory Visit: Payer: No Typology Code available for payment source

## 2021-01-19 DIAGNOSIS — Z8639 Personal history of other endocrine, nutritional and metabolic disease: Secondary | ICD-10-CM

## 2021-01-19 DIAGNOSIS — I700 Atherosclerosis of aorta: Secondary | ICD-10-CM

## 2021-01-19 DIAGNOSIS — E785 Hyperlipidemia, unspecified: Secondary | ICD-10-CM

## 2021-01-19 DIAGNOSIS — E559 Vitamin D deficiency, unspecified: Secondary | ICD-10-CM

## 2021-01-19 DIAGNOSIS — Z0000 Encounter for general adult medical examination without abnormal findings: Secondary | ICD-10-CM

## 2021-01-19 NOTE — Telephone Encounter (Signed)
Pt. Called back stating she was in recently for her CPE and you guys had ordered a DEXA scan. They called her to schedule it but they are out 6 months and she wasn't sure if you were ok with her not having it until March 2023.

## 2021-01-19 NOTE — Telephone Encounter (Signed)
Pt was notified of results

## 2021-01-20 ENCOUNTER — Other Ambulatory Visit: Payer: Self-pay | Admitting: Medical

## 2021-01-20 ENCOUNTER — Other Ambulatory Visit: Payer: Self-pay | Admitting: Internal Medicine

## 2021-01-20 LAB — CBC
Hematocrit: 41.1 % (ref 34.0–46.6)
Hemoglobin: 13.5 g/dL (ref 11.1–15.9)
MCH: 31.6 pg (ref 26.6–33.0)
MCHC: 32.8 g/dL (ref 31.5–35.7)
MCV: 96 fL (ref 79–97)
Platelets: 291 10*3/uL (ref 150–450)
RBC: 4.27 x10E6/uL (ref 3.77–5.28)
RDW: 12.4 % (ref 11.7–15.4)
WBC: 7.3 10*3/uL (ref 3.4–10.8)

## 2021-01-20 LAB — COMPREHENSIVE METABOLIC PANEL
ALT: 6 IU/L (ref 0–32)
AST: 12 IU/L (ref 0–40)
Albumin/Globulin Ratio: 2.3 — ABNORMAL HIGH (ref 1.2–2.2)
Albumin: 4.6 g/dL (ref 3.8–4.8)
Alkaline Phosphatase: 46 IU/L (ref 44–121)
BUN/Creatinine Ratio: 18 (ref 12–28)
BUN: 17 mg/dL (ref 8–27)
Bilirubin Total: 0.3 mg/dL (ref 0.0–1.2)
CO2: 24 mmol/L (ref 20–29)
Calcium: 10.4 mg/dL — ABNORMAL HIGH (ref 8.7–10.3)
Chloride: 100 mmol/L (ref 96–106)
Creatinine, Ser: 0.95 mg/dL (ref 0.57–1.00)
Globulin, Total: 2 g/dL (ref 1.5–4.5)
Glucose: 106 mg/dL — ABNORMAL HIGH (ref 65–99)
Potassium: 4.6 mmol/L (ref 3.5–5.2)
Sodium: 141 mmol/L (ref 134–144)
Total Protein: 6.6 g/dL (ref 6.0–8.5)
eGFR: 66 mL/min/{1.73_m2} (ref >59)

## 2021-01-20 LAB — LIPID PANEL
Chol/HDL Ratio: 2 ratio (ref 0.0–4.4)
Cholesterol, Total: 179 mg/dL (ref 100–199)
HDL: 89 mg/dL (ref >39)
LDL Chol Calc (NIH): 78 mg/dL (ref 0–99)
Triglycerides: 60 mg/dL (ref 0–149)
VLDL Cholesterol Cal: 12 mg/dL (ref 5–40)

## 2021-01-20 LAB — TSH+FREE T4
Free T4: 1.89 ng/dL — ABNORMAL HIGH (ref 0.82–1.77)
TSH: 0.587 u[IU]/mL (ref 0.450–4.500)

## 2021-01-20 LAB — VITAMIN D 25 HYDROXY (VIT D DEFICIENCY, FRACTURES): Vit D, 25-Hydroxy: 32.8 ng/mL (ref 30.0–100.0)

## 2021-01-20 MED ORDER — ASPIRIN EC 81 MG PO TBEC: 81.0000 mg | DELAYED_RELEASE_TABLET | Freq: Every day | ORAL | 3 refills | Status: DC

## 2021-01-20 MED ORDER — VITAMIN D 50 MCG (2000 UT) PO CAPS: 1.0000 | ORAL_CAPSULE | Freq: Every day | ORAL | 3 refills | Status: DC

## 2021-01-20 MED ORDER — LEVOTHYROXINE SODIUM 88 MCG PO TABS: 88.0000 ug | ORAL_TABLET | Freq: Every day | ORAL | 2 refills | Status: DC

## 2021-01-30 ENCOUNTER — Ambulatory Visit (INDEPENDENT_AMBULATORY_CARE_PROVIDER_SITE_OTHER): Payer: No Typology Code available for payment source | Admitting: Physician Assistant

## 2021-01-30 ENCOUNTER — Other Ambulatory Visit: Payer: Self-pay

## 2021-01-30 ENCOUNTER — Encounter: Payer: Self-pay | Admitting: Physician Assistant

## 2021-01-30 DIAGNOSIS — M5416 Radiculopathy, lumbar region: Secondary | ICD-10-CM | POA: Diagnosis not present

## 2021-01-30 DIAGNOSIS — M5136 Other intervertebral disc degeneration, lumbar region: Secondary | ICD-10-CM | POA: Diagnosis not present

## 2021-01-30 NOTE — Progress Notes (Signed)
Office Visit Note   Patient: Jodi Flynn           Date of Birth: February 13, 1956           MRN: [id number] Visit Date: 01/30/2021              Requested by: Carlena Hurl, PA-C [address] [place],  [place] [address] PCP: Carlena Hurl, PA-C   Assessment & Plan: Visit Diagnoses:  1. Lumbar radicular pain   2. Other intervertebral disc degeneration, lumbar region     Plan: Given patient's continued pain down the left leg despite conservative treatment and the fact that it is waking her at night.  Recommend MRI of the lumbar spine to rule out HNP as a source of her radicular pain.  Have her back after the MRI to go over results and discuss further treatment.  Questions were encouraged and answered at length.  Follow-Up Instructions: Return After MRI.   Orders:  No orders of the defined types were placed in this encounter.  No orders of the defined types were placed in this encounter.     Procedures: No procedures performed   Clinical Data: No additional findings.   Subjective: Chief Complaint  Patient presents with   Lower Back - Pain    HPI Ms. Flynn returns today due to low back pain and left hip pain.  She is last seen her office April 2021 and was treated for trochanteric bursitis.  She has continued to have pain down her lateral hip to her knee.  She denies any numbness tingling.  She states she is also having low back pain.  She is having waking pain.  No bowel bladder dysfunction no saddle anesthesia like symptoms.  She has gone to formal physical therapy and repeat ports 8-10 visits for her back which did not help much.  Pain occurs whenever she is walking mostly.  She does have some pain at night when lying down.  Is having no groin pain.  Does describe some heartburn-like sensation down the leg.  Takes Tylenol for the pain without any real relief. Radiographs of the lumbar spine dated 01/18/2021 are reviewed and shows diffuse degenerative changes of  the lumbar spine.  Moderate facet degenerative changes particularly the lower lumbar spine.  No acute fractures.  Arthrosclerosis aorta noted. Review of Systems  Constitutional:  Negative for chills and fever.  Musculoskeletal:  Positive for back pain.    Objective: Vital Signs: There were no vitals taken for this visit.  Physical Exam Constitutional:      Appearance: She is not ill-appearing or diaphoretic.  Cardiovascular:     Pulses: Normal pulses.  Pulmonary:     Effort: Pulmonary effort is normal.  Neurological:     Mental Status: She is alert and oriented to person, place, and time.  Psychiatric:        Mood and Affect: Mood normal.    Ortho Exam Bilateral hips excellent range of motion without pain.  Tenderness over the left trochanteric region down the entire IT band.  Positive straight leg raise on the left negative on the right.  5-5 strength throughout the lower extremities against resistance.  Full forward flexion lumbar spine without pain.  Limited dorsal pedal pulses are 2+ bilaterally equal symmetric. Specialty Comments:  No specialty comments available.  Imaging: No results found.   PMFS History: Patient Active Problem List   Diagnosis Date Noted   Chronic left-sided low back pain with left-sided sciatica  01/17/2021   Radicular pain of left lower extremity 01/17/2021   Screening for heart disease 01/17/2021   Other bursal cyst, unspecified wrist 06/23/2020   Thumb pain, right 06/23/2020   Left Achilles tendinitis 06/23/2020   It band syndrome, left 06/23/2020   Former smoker 11/23/2019   Essential hypertension, benign 11/23/2019   Counseling regarding advanced directives 11/23/2019   Left hip pain 07/22/2019   Trochanteric bursitis of left hip 03/11/2019   Pulsatile tinnitus of right ear 03/11/2019   Family history of carotid artery stenosis 03/11/2019   Vitamin D deficiency 12/15/2018   Hyperlipidemia 11/19/2018   Osteopenia 11/19/2018   Screening  for breast cancer 11/18/2017   Screen for colon cancer 11/18/2017   Atherosclerosis of aorta ([place]) 08/09/2016   Need for pneumococcal vaccination 08/09/2016   RBBB 05/24/2016   COPD suggested by initial evaluation ([place]) 04/10/2016   Nonspecific abnormal electrocardiogram (ECG) (EKG) 04/10/2016   Post-menopausal 02/23/2016   Abdominal bruit 02/23/2016   Microscopic hematuria 02/23/2016   Encounter for health maintenance examination in adult 02/18/2015   Osteoporosis 02/26/2014   Other specified hypothyroidism 02/26/2014   Family history of premature coronary heart disease 02/25/2013   History of Graves' disease 02/25/2013   Past Medical History:  Diagnosis Date   Allergy    Family history of premature coronary heart disease    Grave's disease    History of cervical cancer    s/p hysterectomy   History of renal stone    Hx of colonic polyps 2007   Osteopenia    bone density scans 2015, 2020   Tobacco use disorder    Wears glasses     Family History  Problem Relation Age of Onset   COPD Mother        respiratory failure   Kidney disease Mother    Hypertension Mother    Hyperlipidemia Mother    Heart disease Father        died of MI, late 58s   Hypertension Father    Hyperlipidemia Father    Stroke Father    Hypertension Brother    Hyperlipidemia Brother    Kidney disease Brother        kidney stones   Diabetes Brother        borderline   Hyperlipidemia Brother    Hypertension Brother    Cancer Neg Hx    Colon cancer Neg Hx    Esophageal cancer Neg Hx    Rectal cancer Neg Hx    Stomach cancer Neg Hx     Past Surgical History:  Procedure Laterality Date   ABDOMINAL HYSTERECTOMY     ovaries intact; age 83yo   COLONOSCOPY  age 1, 12/12/05   Dr. Verl Blalock   COLONOSCOPY  11/2017   normal; Dr. Wilfrid Lund   COLPOSCOPY     age 75   VEIN SURGERY  1/12   laser ablation left great saphenous vein , stab phlebectomy of multiple varicosities, Dr. Donnetta Hutching     Social History   Occupational History   Occupation: Engineer, drilling for [employer]    Employer: [employer]  Tobacco Use   Smoking status: Some Days    Packs/day: 0.50    Years: 42.00    Pack years: 21.00    Types: Cigarettes   Smokeless tobacco: Never  Vaping Use   Vaping Use: Never used  Substance and Sexual Activity   Alcohol use: Not Currently    Alcohol/week: 7.0 standard drinks    Types:  5 Glasses of wine, 2 Standard drinks or equivalent per week   Drug use: No   Sexual activity: Yes

## 2021-01-30 NOTE — Addendum Note (Signed)
Addended by: Robyne Peers on: 01/30/2021 03:19 PM   Modules accepted: Orders

## 2021-02-07 ENCOUNTER — Other Ambulatory Visit: Payer: Self-pay | Admitting: Medical

## 2021-02-07 DIAGNOSIS — Z780 Asymptomatic menopausal state: Secondary | ICD-10-CM

## 2021-02-07 DIAGNOSIS — M8580 Other specified disorders of bone density and structure, unspecified site: Secondary | ICD-10-CM

## 2021-02-16 ENCOUNTER — Telehealth: Payer: Self-pay | Admitting: Medical

## 2021-02-16 NOTE — Telephone Encounter (Signed)
I have faxed over information from last bone density in 2020 and asked them to compare

## 2021-02-16 NOTE — Telephone Encounter (Signed)
I received a bone density test but I do not think they had the old one to compare.  Normally they will comment on comparisons to the old bone density test.  Please fax over a copy of the 2020 bone density test to [facility] and ask them to do review back over and comment on the comparison

## 2021-02-18 ENCOUNTER — Ambulatory Visit
Admission: RE | Admit: 2021-02-18 | Discharge: 2021-02-18 | Disposition: A | Discharge status: Home / Self Care | Payer: No Typology Code available for payment source | Source: Ambulatory Visit | Attending: Physician Assistant | Admitting: Physician Assistant

## 2021-02-18 ENCOUNTER — Other Ambulatory Visit: Payer: Self-pay

## 2021-02-18 DIAGNOSIS — M5416 Radiculopathy, lumbar region: Secondary | ICD-10-CM

## 2021-02-21 ENCOUNTER — Encounter: Payer: Self-pay | Admitting: Medical

## 2021-02-23 ENCOUNTER — Encounter: Payer: Self-pay | Admitting: Physician Assistant

## 2021-02-23 ENCOUNTER — Ambulatory Visit (INDEPENDENT_AMBULATORY_CARE_PROVIDER_SITE_OTHER): Payer: No Typology Code available for payment source | Admitting: Physician Assistant

## 2021-02-23 ENCOUNTER — Other Ambulatory Visit: Payer: Self-pay

## 2021-02-23 DIAGNOSIS — M5416 Radiculopathy, lumbar region: Secondary | ICD-10-CM | POA: Diagnosis not present

## 2021-02-23 NOTE — Progress Notes (Signed)
HPI: Ms. Martinique returns today to go over the MRI of her lumbar spine.  She continues to have low back pain with radicular symptoms down the left leg to the knee no numbness tingling just pain down the leg.  She is having no symptoms on the right.  Otherwise her conditions remain unchanged. MRI dated 02/19/2019 was reviewed with patient.  Actual images and also a spine model are used to visualize the anatomy and pathology.  MRI shows a broad-based disc bulge at L4-5 with moderate bilateral neural foraminal narrowing.  Also moderate left and mild right facet joint arthropathy.  Mild neuroforaminal was seen on the right at L3-4 and also bilaterally mild neural foramen at L1-2.  Review of systems: See HPI otherwise negative  Physical exam: Straight leg raise positive on the left negative on the right.  Tight hamstrings bilaterally.  Impression: Lumbar radiculopathy  Plan: We will send her for epidural steroid injection left foraminal L4-5 with Dr. Ernestina Patches.  See her back 2 weeks after the injection to see what type of response she had.  Questions encouraged and answered at length.  Copy of MRI report was given.

## 2021-03-08 ENCOUNTER — Other Ambulatory Visit: Payer: Self-pay | Admitting: Medical

## 2021-03-09 ENCOUNTER — Telehealth: Payer: Self-pay

## 2021-03-09 ENCOUNTER — Other Ambulatory Visit: Payer: Self-pay

## 2021-03-09 DIAGNOSIS — M5416 Radiculopathy, lumbar region: Secondary | ICD-10-CM

## 2021-03-09 NOTE — Telephone Encounter (Signed)
Pt called stating that she hasn't heard anything regarding an apt with Dr. Ernestina Patches please advise

## 2021-03-09 NOTE — Telephone Encounter (Signed)
Order missed and just put in

## 2021-03-10 NOTE — Telephone Encounter (Signed)
Please advise on status so I can let pt. Know. Thank you

## 2021-03-14 ENCOUNTER — Telehealth: Payer: Self-pay | Admitting: Physical Medicine and Rehabilitation

## 2021-03-14 NOTE — Telephone Encounter (Signed)
Pt called requesting a call back concerning her referral. Please call pt about appt at [phone].

## 2021-03-15 NOTE — Telephone Encounter (Signed)
Pt portal message was sent to the pt informing her that the order was originally missed and was just put in on the 10th. Also let her know she will be contacted soon

## 2021-03-15 NOTE — Telephone Encounter (Signed)
Patient is calling for the 4th time following up on a referral from Dr. Carlis Abbott to Dr. Ernestina Patches. She has not been contacted and the referral is from 10/27.

## 2021-03-17 ENCOUNTER — Other Ambulatory Visit: Payer: Self-pay | Admitting: Medical

## 2021-04-06 ENCOUNTER — Ambulatory Visit: Payer: Self-pay

## 2021-04-06 ENCOUNTER — Other Ambulatory Visit: Payer: Self-pay

## 2021-04-06 ENCOUNTER — Encounter: Payer: Self-pay | Admitting: Physical Medicine and Rehabilitation

## 2021-04-06 ENCOUNTER — Ambulatory Visit (INDEPENDENT_AMBULATORY_CARE_PROVIDER_SITE_OTHER): Payer: No Typology Code available for payment source | Admitting: Physical Medicine and Rehabilitation

## 2021-04-06 VITALS — BP 148/76 | HR 89

## 2021-04-06 DIAGNOSIS — M5416 Radiculopathy, lumbar region: Secondary | ICD-10-CM

## 2021-04-06 MED ORDER — METHYLPREDNISOLONE ACETATE 80 MG/ML IJ SUSP
80.0000 mg | Freq: Once | INTRAMUSCULAR | Status: AC
  Administered 2021-04-06: 09:00:00 80 mg

## 2021-04-06 NOTE — Progress Notes (Signed)
Pt state lower back pain that travels down to her left thigh and knee. Pt state twisting, bending, walking and standing makes the pain worse. Pt state she takes over the counter pain meds to help ease her pain.  Numeric Pain Rating Scale and Functional Assessment Average Pain 2   In the last MONTH (on 0-10 scale) has pain interfered with the following?  1. General activity like being  able to carry out your everyday physical activities such as walking, climbing stairs, carrying groceries, or moving a chair?  Rating(8)   +Driver, -BT, -Dye Allergies.

## 2021-04-06 NOTE — Patient Instructions (Signed)

## 2021-04-14 ENCOUNTER — Other Ambulatory Visit: Payer: Self-pay | Admitting: Medical

## 2021-04-17 NOTE — Progress Notes (Signed)
Jodi Flynn - 65 y.o. female MRN [id number]  Date of birth: May 09, 1955  Office Visit Note: Visit Date: 04/06/2021 PCP: Carlena Hurl, PA-C Referred by: Carlena Hurl, PA-C  Subjective: Chief Complaint  Patient presents with   Lower Back - Pain   Left Thigh - Pain   Left Knee - Pain   HPI:  Jodi Flynn is a 65 y.o. female who comes in today at the request of Benita Stabile, PA-C for planned Left L4-5 Lumbar Transforaminal epidural steroid injection with fluoroscopic guidance.  The patient has failed conservative care including home exercise, medications, time and activity modification.  This injection will be diagnostic and hopefully therapeutic.  Please see requesting physician notes for further details and justification. MRI reviewed with images and spine model.  MRI reviewed in the note below. Consider interlaminar approach or facet block if needed.   ROS Otherwise per HPI.  Assessment & Plan: Visit Diagnoses:    ICD-10-CM   1. Lumbar radiculopathy  M54.16 XR C-ARM NO REPORT    Epidural Steroid injection    methylPREDNISolone acetate (DEPO-MEDROL) injection 80 mg      Plan: No additional findings.   Meds & Orders:  Meds ordered this encounter  Medications   methylPREDNISolone acetate (DEPO-MEDROL) injection 80 mg    Orders Placed This Encounter  Procedures   XR C-ARM NO REPORT   Epidural Steroid injection    Follow-up: Return if symptoms worsen or fail to improve.   Procedures: No procedures performed  Lumbosacral Transforaminal Epidural Steroid Injection - Sub-Pedicular Approach with Fluoroscopic Guidance  Patient: Jodi Flynn      Date of Birth: [birth year] MRN: [id number] PCP: Carlena Hurl, PA-C      Visit Date: 04/06/2021   Universal Protocol:    Date/Time: 04/06/2021  Consent Given By: the patient  Position: PRONE  Additional Comments: Vital signs were monitored before and after the procedure. Patient was prepped and draped in  the usual sterile fashion. The correct patient, procedure, and site was verified.   Injection Procedure Details:   Procedure diagnoses: Lumbar radiculopathy [M54.16]    Meds Administered:  Meds ordered this encounter  Medications   methylPREDNISolone acetate (DEPO-MEDROL) injection 80 mg    Laterality: Left  Location/Site: L4  Needle:5.0 in., 22 ga.  Short bevel or Quincke spinal needle  Needle Placement: Transforaminal  Findings:    -Comments: Excellent flow of contrast along the nerve, nerve root and into the epidural space.  Procedure Details: After squaring off the end-plates to get a true AP view, the C-arm was positioned so that an oblique view of the foramen as noted above was visualized. The target area is just inferior to the "nose of the scotty dog" or sub pedicular. The soft tissues overlying this structure were infiltrated with 2-3 ml. of 1% Lidocaine without Epinephrine.  The spinal needle was inserted toward the target using a "trajectory" view along the fluoroscope beam.  Under AP and lateral visualization, the needle was advanced so it did not puncture dura and was located close the 6 O'Clock position of the pedical in AP tracterory. Biplanar projections were used to confirm position. Aspiration was confirmed to be negative for CSF and/or blood. A 1-2 ml. volume of Isovue-250 was injected and flow of contrast was noted at each level. Radiographs were obtained for documentation purposes.   After attaining the desired flow of contrast documented above, a 0.5 to 1.0 ml test dose of 0.25% Marcaine was injected into  each respective transforaminal space.  The patient was observed for 90 seconds post injection.  After no sensory deficits were reported, and normal lower extremity motor function was noted,   the above injectate was administered so that equal amounts of the injectate were placed at each foramen (level) into the transforaminal epidural space.   Additional  Comments:  The patient tolerated the procedure well Dressing: 2 x 2 sterile gauze and Band-Aid    Post-procedure details: Patient was observed during the procedure. Post-procedure instructions were reviewed.  Patient left the clinic in stable condition.    Clinical History: MRI LUMBAR SPINE WITHOUT CONTRAST   TECHNIQUE: Multiplanar, multisequence MR imaging of the lumbar spine was performed. No intravenous contrast was administered.   COMPARISON:  None.   FINDINGS: Segmentation:  Standard.   Alignment:  Physiologic.   Vertebrae: No fracture, evidence of discitis, or bone lesion. Congenitally short pedicles, which narrow the AP diameter of the spinal canal.   Conus medullaris and cauda equina: Conus extends to the T12-L1 level. Conus and cauda equina appear normal.   Paraspinal and other soft tissues: Right renal cyst. Otherwise negative.   Disc levels:   T12-L1: No significant disc bulge. No spinal canal stenosis or neural foraminal narrowing.   L1-L2: No significant disc bulge. Mild right facet arthropathy. No spinal canal stenosis. Mild bilateral neural foraminal narrowing.   L2-L3: Minimal disc bulge. Moderate facet arthropathy. No spinal canal stenosis. No neural foraminal narrowing.   L3-L4: Mild disc bulge. Mild facet arthropathy. No spinal canal stenosis. Mild right neural foraminal narrowing.   L4-L5: Broad-based disc bulge. Moderate left and mild right facet arthropathy. No spinal canal stenosis. Moderate bilateral neural foraminal narrowing.   L5-S1: Mild disc bulge. Mild facet arthropathy. No spinal canal stenosis or neural foraminal narrowing.   IMPRESSION: 1. L4-L5 moderate bilateral neural foraminal narrowing. 2. L1-L2 mild bilateral neural foraminal narrowing. 3. L3-L4 mild right neural foraminal narrowing.     Electronically Signed   By: Merilyn Baba M.D.   On: 02/20/2021 02:41     Objective:  VS:  HT:     WT:    BMI:      BP:(!)  148/76   HR:89bpm   TEMP: ( )   RESP:  Physical Exam Vitals and nursing note reviewed.  Constitutional:      General: She is not in acute distress.    Appearance: Normal appearance. She is not ill-appearing.  HENT:     Head: Normocephalic and atraumatic.     Right Ear: External ear normal.     Left Ear: External ear normal.  Eyes:     Extraocular Movements: Extraocular movements intact.  Cardiovascular:     Rate and Rhythm: Normal rate.     Pulses: Normal pulses.  Pulmonary:     Effort: Pulmonary effort is normal. No respiratory distress.  Abdominal:     General: There is no distension.     Palpations: Abdomen is soft.  Musculoskeletal:        General: Tenderness present.     Cervical back: Neck supple.     Right lower leg: No edema.     Left lower leg: No edema.     Comments: Patient has good distal strength with no pain over the greater trochanters.  No clonus or focal weakness.  Skin:    Findings: No erythema, lesion or rash.  Neurological:     General: No focal deficit present.     Mental Status: She is  alert and oriented to person, place, and time.     Sensory: No sensory deficit.     Motor: No weakness or abnormal muscle tone.     Coordination: Coordination normal.  Psychiatric:        Mood and Affect: Mood normal.        Behavior: Behavior normal.     Imaging: No results found.

## 2021-04-17 NOTE — Procedures (Signed)
Lumbosacral Transforaminal Epidural Steroid Injection - Sub-Pedicular Approach with Fluoroscopic Guidance  Patient: Jodi Flynn      Date of Birth: 08-22-1955 MRN: [id number] PCP: Carlena Hurl, PA-C      Visit Date: 04/06/2021   Universal Protocol:    Date/Time: 04/06/2021  Consent Given By: the patient  Position: PRONE  Additional Comments: Vital signs were monitored before and after the procedure. Patient was prepped and draped in the usual sterile fashion. The correct patient, procedure, and site was verified.   Injection Procedure Details:   Procedure diagnoses: Lumbar radiculopathy [M54.16]    Meds Administered:  Meds ordered this encounter  Medications   methylPREDNISolone acetate (DEPO-MEDROL) injection 80 mg    Laterality: Left  Location/Site: L4  Needle:5.0 in., 22 ga.  Short bevel or Quincke spinal needle  Needle Placement: Transforaminal  Findings:    -Comments: Excellent flow of contrast along the nerve, nerve root and into the epidural space.  Procedure Details: After squaring off the end-plates to get a true AP view, the C-arm was positioned so that an oblique view of the foramen as noted above was visualized. The target area is just inferior to the "nose of the scotty dog" or sub pedicular. The soft tissues overlying this structure were infiltrated with 2-3 ml. of 1% Lidocaine without Epinephrine.  The spinal needle was inserted toward the target using a "trajectory" view along the fluoroscope beam.  Under AP and lateral visualization, the needle was advanced so it did not puncture dura and was located close the 6 O'Clock position of the pedical in AP tracterory. Biplanar projections were used to confirm position. Aspiration was confirmed to be negative for CSF and/or blood. A 1-2 ml. volume of Isovue-250 was injected and flow of contrast was noted at each level. Radiographs were obtained for documentation purposes.   After attaining the desired  flow of contrast documented above, a 0.5 to 1.0 ml test dose of 0.25% Marcaine was injected into each respective transforaminal space.  The patient was observed for 90 seconds post injection.  After no sensory deficits were reported, and normal lower extremity motor function was noted,   the above injectate was administered so that equal amounts of the injectate were placed at each foramen (level) into the transforaminal epidural space.   Additional Comments:  The patient tolerated the procedure well Dressing: 2 x 2 sterile gauze and Band-Aid    Post-procedure details: Patient was observed during the procedure. Post-procedure instructions were reviewed.  Patient left the clinic in stable condition.

## 2021-04-20 ENCOUNTER — Other Ambulatory Visit: Payer: Self-pay

## 2021-04-20 ENCOUNTER — Ambulatory Visit (INDEPENDENT_AMBULATORY_CARE_PROVIDER_SITE_OTHER): Payer: No Typology Code available for payment source | Admitting: Physician Assistant

## 2021-04-20 ENCOUNTER — Encounter: Payer: Self-pay | Admitting: Physician Assistant

## 2021-04-20 DIAGNOSIS — M5416 Radiculopathy, lumbar region: Secondary | ICD-10-CM | POA: Diagnosis not present

## 2021-04-20 NOTE — Progress Notes (Signed)
HPI: Jodi Flynn returns today status post left L4 transforaminal injection she states she is doing great she is having no pain down her left leg at this point time.  She states she is able to walk all day and shop without any pain whatsoever.  Very happy with the results.  Physical exam: General well-developed well-nourished female in no acute distress ambulates without any assistive device. Left leg negative straight leg raise.  Impression: Lumbar radiculopathy.  Plan: At this point time we will have her call if the pain returns.  If she has recurrence of similar symptoms we can simply just set her up for another left L4 transforaminal injection.  Otherwise she will follow-up with [place] as needed.  Questions were encouraged and answered

## 2021-05-10 ENCOUNTER — Other Ambulatory Visit: Payer: Self-pay

## 2021-05-10 ENCOUNTER — Encounter: Payer: Self-pay | Admitting: Podiatry

## 2021-05-10 ENCOUNTER — Ambulatory Visit (INDEPENDENT_AMBULATORY_CARE_PROVIDER_SITE_OTHER): Payer: No Typology Code available for payment source | Admitting: Podiatry

## 2021-05-10 DIAGNOSIS — B351 Tinea unguium: Secondary | ICD-10-CM | POA: Diagnosis not present

## 2021-05-10 NOTE — Progress Notes (Signed)
Subjective:   [name], female   DOB: 66 y.o.   MRN: [id number]   HPI Patient presents stating that she has discoloration of the big toenails of both feet that she has noticed more recently.  Does not remember specific injury and does wear a soft type shoe and patient does smoke occasionally does like to be active   Review of Systems  All other systems reviewed and are negative.      Objective:  Physical Exam Vitals and nursing note reviewed.  Constitutional:      Appearance: She is well-developed.  Pulmonary:     Effort: Pulmonary effort is normal.  Musculoskeletal:        General: Normal range of motion.  Skin:    General: Skin is warm.  Neurological:     Mental Status: She is alert.    Neurovascular status intact muscle strength adequate range of motion within normal limits with patient found to have discoloration of the hallux nails of both feet that are localized with no other pathology noted.  Patient has good digit perfusion well oriented x3     Assessment:  Combination of trauma with possible mycotic nail infection hallux bilateral      Plan:  H&P education rendered and at this point I recommended topical medicine and do not want to use oral or laser even though it may be necessary if they get worse.  Educated her on trauma versus fungus and patient will be seen back as needed

## 2021-05-19 ENCOUNTER — Other Ambulatory Visit: Payer: Self-pay | Admitting: Medical

## 2021-05-19 ENCOUNTER — Other Ambulatory Visit: Payer: Self-pay

## 2021-05-19 ENCOUNTER — Telehealth: Payer: Self-pay | Admitting: Internal Medicine

## 2021-05-19 MED ORDER — LEVOTHYROXINE SODIUM 88 MCG PO TABS: 88.0000 ug | ORAL_TABLET | Freq: Every day | ORAL | 2 refills | Status: DC

## 2021-05-19 NOTE — Telephone Encounter (Signed)
Sent in refill ok approval for [facility] brand

## 2021-05-19 NOTE — Telephone Encounter (Signed)
Pt's pharmacy called and states they can no longer get the brand of synthroid patient has been taking and want to switch to [facility] brand of synthroid. Is this ok

## 2021-05-19 NOTE — Telephone Encounter (Signed)
Waiting on shane to let me know if ok to switch to another brand per pharmacy

## 2021-06-15 ENCOUNTER — Other Ambulatory Visit: Payer: Self-pay | Admitting: Medical

## 2021-09-27 ENCOUNTER — Other Ambulatory Visit: Payer: Self-pay | Admitting: Medical

## 2021-10-09 ENCOUNTER — Other Ambulatory Visit: Payer: Self-pay | Admitting: Medical

## 2021-11-06 ENCOUNTER — Encounter: Payer: Self-pay | Admitting: Medical

## 2022-01-03 ENCOUNTER — Encounter: Payer: Self-pay | Admitting: Internal Medicine

## 2022-01-10 ENCOUNTER — Other Ambulatory Visit: Payer: Self-pay | Admitting: Medical

## 2022-01-16 ENCOUNTER — Other Ambulatory Visit: Payer: Self-pay | Admitting: Medical

## 2022-01-23 LAB — HM MAMMOGRAPHY

## 2022-01-25 ENCOUNTER — Telehealth: Payer: Self-pay | Admitting: Physical Medicine and Rehabilitation

## 2022-01-25 ENCOUNTER — Encounter: Payer: Self-pay | Admitting: Internal Medicine

## 2022-01-25 NOTE — Telephone Encounter (Signed)
Pt would like to repeat Left L4 tf esi. Last injection was dec of 2022. Same pain. Worked until now.  CB [phone]

## 2022-01-25 NOTE — Telephone Encounter (Signed)
Order has been placed.

## 2022-02-06 ENCOUNTER — Encounter: Payer: Self-pay | Admitting: Internal Medicine

## 2022-02-12 ENCOUNTER — Ambulatory Visit: Payer: Self-pay

## 2022-02-12 ENCOUNTER — Ambulatory Visit (INDEPENDENT_AMBULATORY_CARE_PROVIDER_SITE_OTHER): Payer: No Typology Code available for payment source | Admitting: Physical Medicine and Rehabilitation

## 2022-02-12 VITALS — BP 118/68 | HR 70

## 2022-02-12 DIAGNOSIS — M5416 Radiculopathy, lumbar region: Secondary | ICD-10-CM

## 2022-02-12 MED ORDER — METHYLPREDNISOLONE ACETATE 80 MG/ML IJ SUSP: 40.0000 mg | Freq: Once | INTRAMUSCULAR | Status: DC

## 2022-02-12 NOTE — Patient Instructions (Signed)

## 2022-02-12 NOTE — Progress Notes (Signed)
Numeric Pain Rating Scale and Functional Assessment Average Pain 2   In the last MONTH (on 0-10 scale) has pain interfered with the following?  1. General activity like being  able to carry out your everyday physical activities such as walking, climbing stairs, carrying groceries, or moving a chair?  Rating(9)   +Driver, -BT- Baby Aspirin, -Dye Allergies.  Walking makes pain worse. Radiates to left leg down to knee. Burning in left lower back and left leg. Tylenol for pain

## 2022-02-14 ENCOUNTER — Encounter: Payer: Self-pay | Admitting: Medical

## 2022-02-14 ENCOUNTER — Ambulatory Visit (INDEPENDENT_AMBULATORY_CARE_PROVIDER_SITE_OTHER): Payer: No Typology Code available for payment source | Admitting: Medical

## 2022-02-14 VITALS — BP 122/70 | HR 58 | Wt 132.8 lb

## 2022-02-14 DIAGNOSIS — E559 Vitamin D deficiency, unspecified: Secondary | ICD-10-CM | POA: Diagnosis not present

## 2022-02-14 DIAGNOSIS — I10 Essential (primary) hypertension: Secondary | ICD-10-CM | POA: Diagnosis not present

## 2022-02-14 DIAGNOSIS — E038 Other specified hypothyroidism: Secondary | ICD-10-CM

## 2022-02-14 DIAGNOSIS — Z8639 Personal history of other endocrine, nutritional and metabolic disease: Secondary | ICD-10-CM | POA: Diagnosis not present

## 2022-02-14 DIAGNOSIS — E785 Hyperlipidemia, unspecified: Secondary | ICD-10-CM | POA: Diagnosis not present

## 2022-02-14 DIAGNOSIS — R911 Solitary pulmonary nodule: Secondary | ICD-10-CM | POA: Insufficient documentation

## 2022-02-14 DIAGNOSIS — F17200 Nicotine dependence, unspecified, uncomplicated: Secondary | ICD-10-CM

## 2022-02-14 DIAGNOSIS — Z0000 Encounter for general adult medical examination without abnormal findings: Secondary | ICD-10-CM | POA: Diagnosis not present

## 2022-02-14 DIAGNOSIS — Z780 Asymptomatic menopausal state: Secondary | ICD-10-CM

## 2022-02-14 DIAGNOSIS — M8580 Other specified disorders of bone density and structure, unspecified site: Secondary | ICD-10-CM

## 2022-02-14 DIAGNOSIS — G8929 Other chronic pain: Secondary | ICD-10-CM

## 2022-02-14 DIAGNOSIS — Z8249 Family history of ischemic heart disease and other diseases of the circulatory system: Secondary | ICD-10-CM

## 2022-02-14 DIAGNOSIS — M5442 Lumbago with sciatica, left side: Secondary | ICD-10-CM

## 2022-02-14 DIAGNOSIS — I700 Atherosclerosis of aorta: Secondary | ICD-10-CM

## 2022-02-14 IMAGING — MR MR LUMBAR SPINE W/O CM
4 of 5 series · 26 of 48 positions shown · non-contrast
Comparison: None.

CLINICAL DATA: Low back pain with left thigh pain

EXAM:
MRI LUMBAR SPINE WITHOUT CONTRAST
TECHNIQUE: Multiplanar, multisequence MR imaging of the lumbar spine was
performed. No intravenous contrast was administered.

[Series 2: T2 · sagittal · 4.0mm · 0.53mm/px · 6 of 15 slices shown (1 of 2)]
[im 1/15]
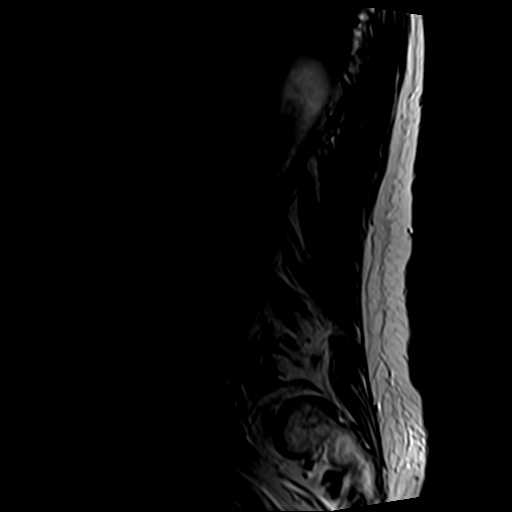
[im 3/15]
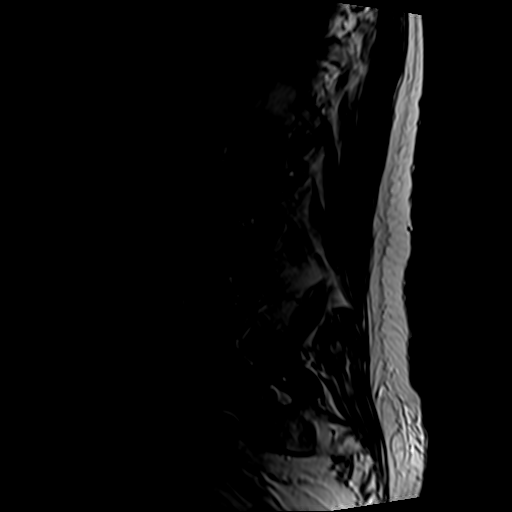
[im 6/15]
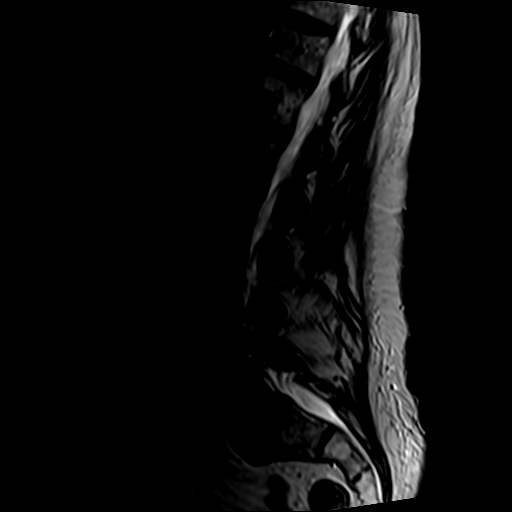
[im 9/15]
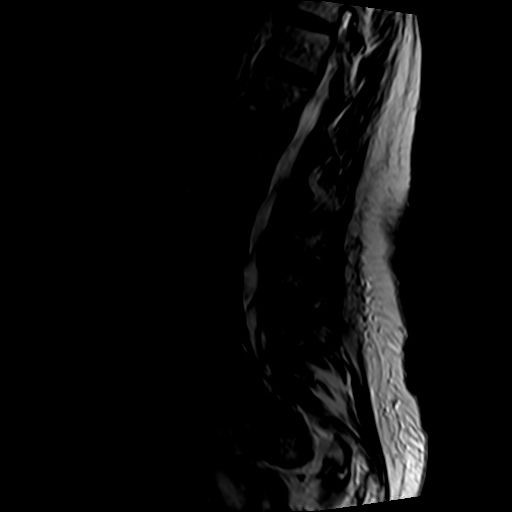
[im 12/15]
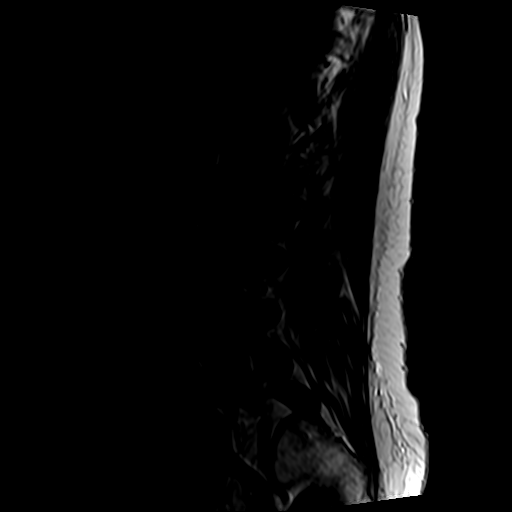
[im 15/15]
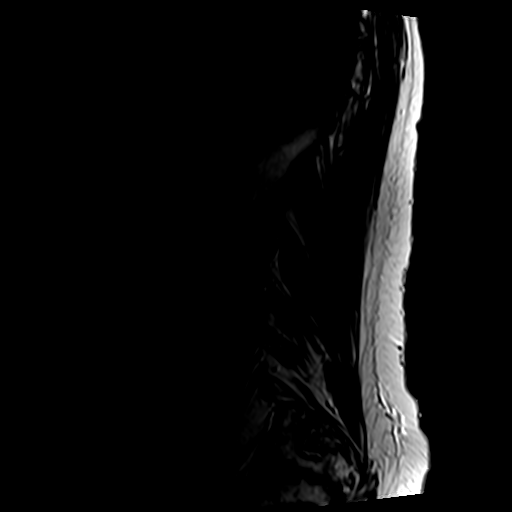

[Series 4: T1 · sagittal · 4.0mm · 0.53mm/px · 6 of 15 slices shown (1 of 2)]
[im 1/15]
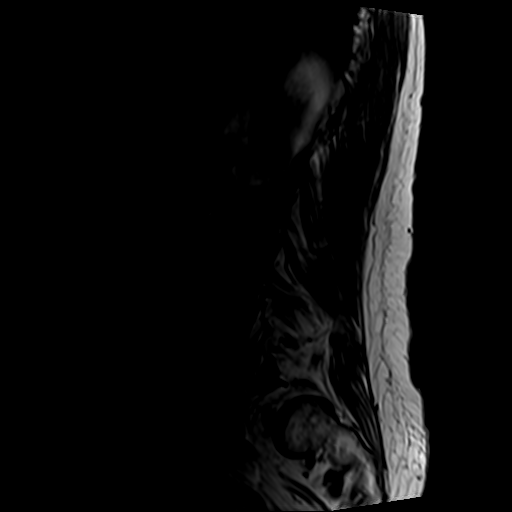
[im 3/15]
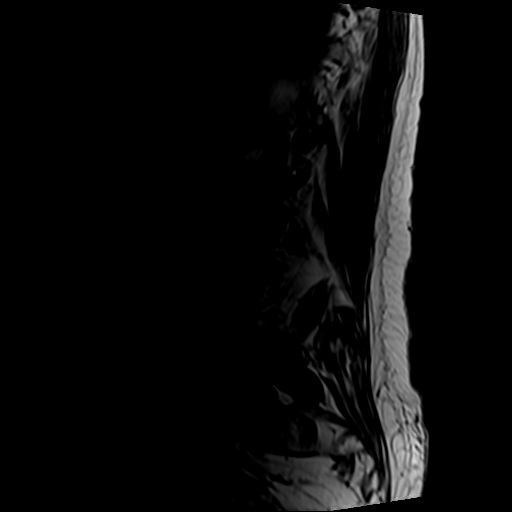
[im 6/15]
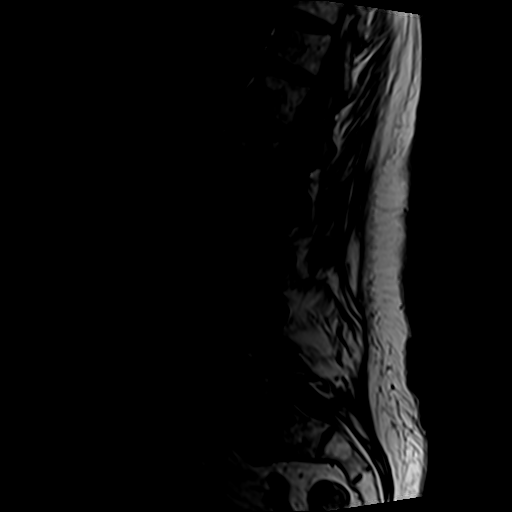
[im 9/15]
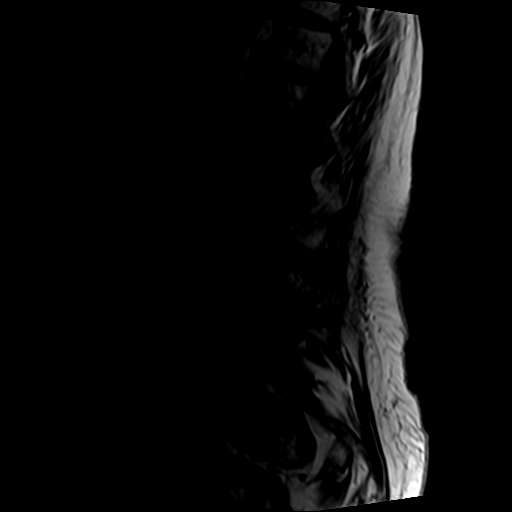
[im 12/15]
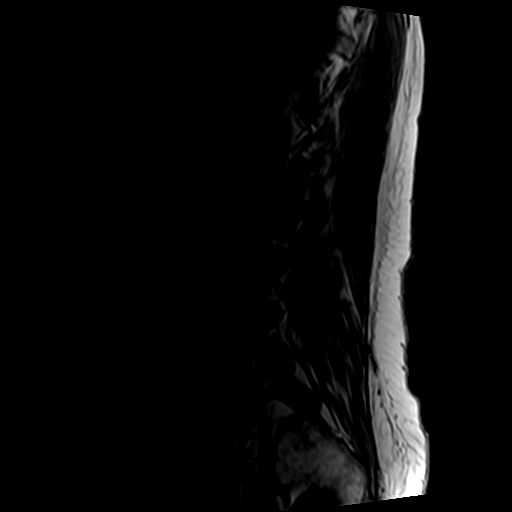
[im 15/15]
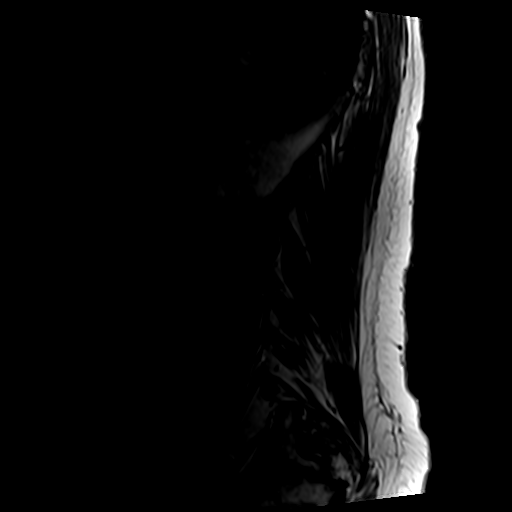

[Series 5: T2 · axial · 4.0mm · 0.70mm/px · z∈[-37,+192]mm · 9 of 36 slices shown (2 of 2)]
[im 1/36]
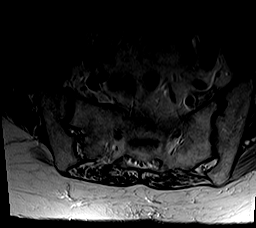
[im 6/36]
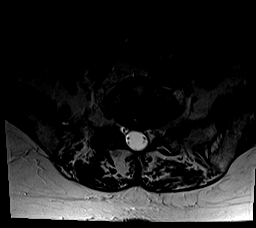
[im 11/36]
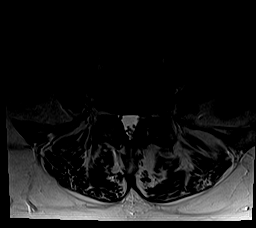
[im 16/36]
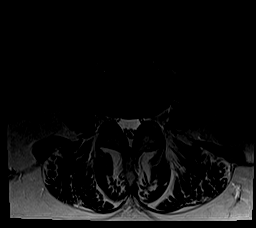
[im 18/36]
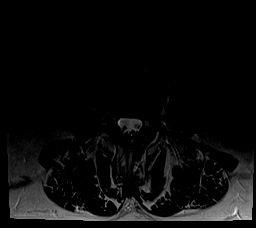
[im 21/36]
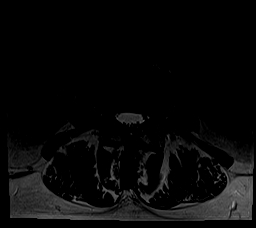
[im 26/36]
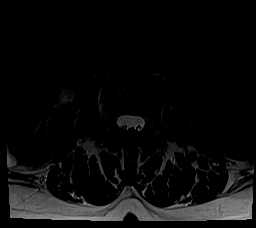
[im 31/36]
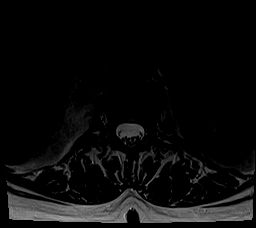
[im 36/36]
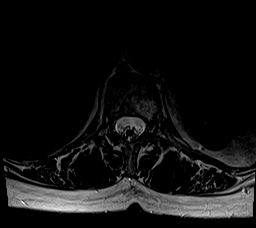

[Series 6: T1 · axial · 4.0mm · 0.35mm/px · z∈[-37,+151]mm · 5 of 36 slices shown (2 of 2)]
[im 1/36]
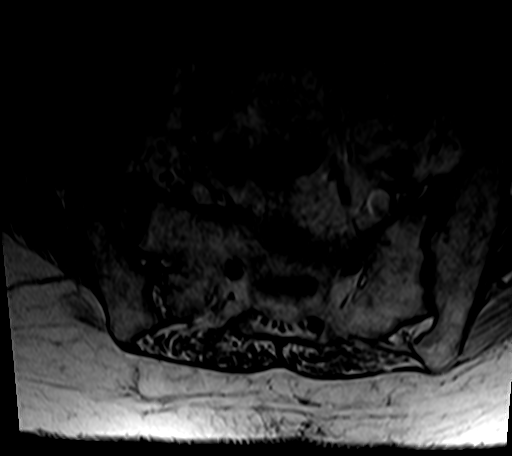
[im 6/36]
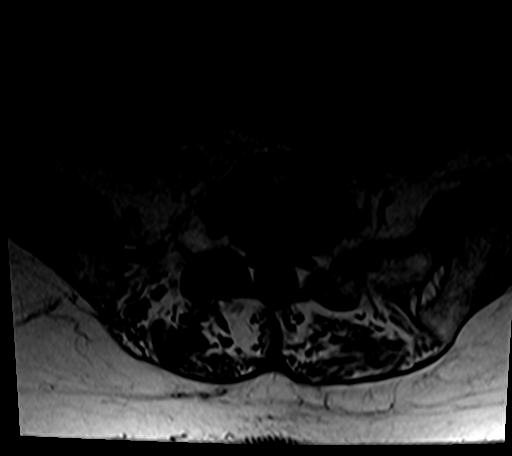
[im 11/36]
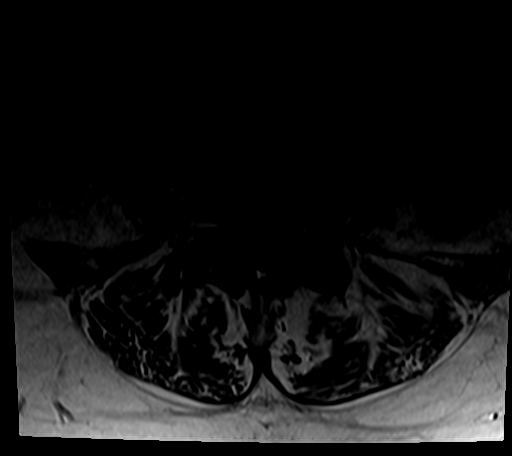
[im 18/36]
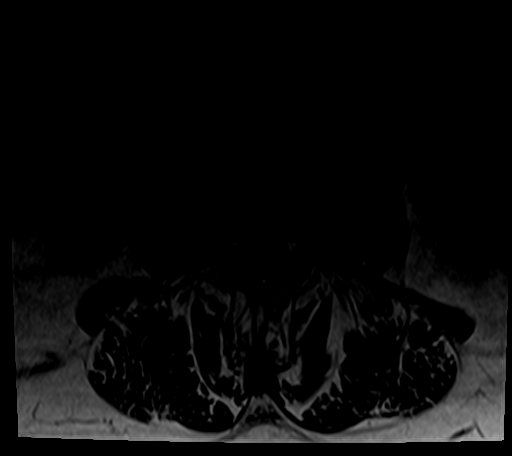
[im 31/36]
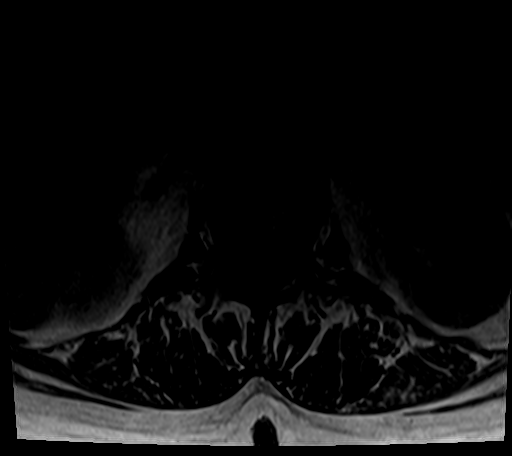

[26 of 48 positions shown; findings below may reference images not displayed]

FINDINGS: Segmentation:  Standard.

Alignment:  Physiologic.

Vertebrae: No fracture, evidence of discitis, or bone lesion.
Congenitally short pedicles, which narrow the AP diameter of the
spinal canal.

Conus medullaris and cauda equina: Conus extends to the T12-L1
level. Conus and cauda equina appear normal.

Paraspinal and other soft tissues: Right renal cyst. Otherwise
negative.

Disc levels:

T12-L1: No significant disc bulge. No spinal canal stenosis or
neural foraminal narrowing.

L1-L2: No significant disc bulge. Mild right facet arthropathy. No
spinal canal stenosis. Mild bilateral neural foraminal narrowing.

L2-L3: Minimal disc bulge. Moderate facet arthropathy. No spinal
canal stenosis. No neural foraminal narrowing.

L3-L4: Mild disc bulge. Mild facet arthropathy. No spinal canal
stenosis. Mild right neural foraminal narrowing.

L4-L5: Broad-based disc bulge. Moderate left and mild right facet
arthropathy. No spinal canal stenosis. Moderate bilateral neural
foraminal narrowing.

L5-S1: Mild disc bulge. Mild facet arthropathy. No spinal canal
stenosis or neural foraminal narrowing.
IMPRESSION: 1. L4-L5 moderate bilateral neural foraminal narrowing.
2. L1-L2 mild bilateral neural foraminal narrowing.
3. L3-L4 mild right neural foraminal narrowing.

## 2022-02-14 NOTE — Progress Notes (Signed)
Subjective:   HPI  Jodi Flynn is a 66 y.o. female who presents for Chief Complaint  Patient presents with   fasting med check    Fasting med check. No concerns. Would like 3 months of refills on meds. Wants thyroid checked before refilling med    Patient Care Team: Shahrukh Pasch, Camelia Eng, PA-C as PCP - General (Family Medicine) Magnus Sinning, MD as Consulting Physician (Physical Medicine and Rehabilitation)  Dr. Wilfrid Lund, gastroenterology Dr. Jean Rosenthal, orthopedics Dr. Larae Grooms, cardiology Sees dentist Sees eye doctor  Concerns: none  Reviewed their medical, surgical, family, social, medication, and allergy history and updated chart as appropriate.  Past Medical History:  Diagnosis Date   Allergy    Family history of premature coronary heart disease    Grave's disease    History of cervical cancer    s/p hysterectomy   History of renal stone    Hx of colonic polyps 2007   Osteopenia    bone density scans 01/2021, 2020,  2015   Tobacco use disorder    Wears glasses     Past Surgical History:  Procedure Laterality Date   ABDOMINAL HYSTERECTOMY     ovaries intact; age 24yo   COLONOSCOPY  age 42, 12/12/05   Dr. Verl Blalock   COLONOSCOPY  11/2017   normal; Dr. Wilfrid Lund   COLPOSCOPY     age 45   VEIN SURGERY  1/12   laser ablation left great saphenous vein , stab phlebectomy of multiple varicosities, Dr. Donnetta Hutching     Social History   Socioeconomic History   Marital status: Widowed    Spouse name: Not on file   Number of children: 2   Years of education: Not on file   Highest education level: Not on file  Occupational History   Occupation: Engineer, drilling for [employer]    Employer: [employer]  Tobacco Use   Smoking status: Some Days    Packs/day: 0.50    Years: 42.00    Total pack years: 21.00    Types: Cigarettes   Smokeless tobacco: Never  Vaping Use   Vaping Use: Never used  Substance and Sexual Activity    Alcohol use: Not Currently    Alcohol/week: 7.0 standard drinks of alcohol    Types: 5 Glasses of wine, 2 Standard drinks or equivalent per week   Drug use: No   Sexual activity: Yes  Other Topics Concern   Not on file  Social History Narrative   Widowed.  2 children, walks for exercise, hobbies: puzzles.  2 prior boyfriends committed suicide, one in front of her.  01/2022   Social Determinants of Health   Financial Resource Strain: Not on file  Food Insecurity: Not on file  Transportation Needs: Not on file  Physical Activity: Not on file  Stress: Not on file  Social Connections: Not on file  Intimate Partner Violence: Not on file    Family History  Problem Relation Age of Onset   COPD Mother        respiratory failure   Kidney disease Mother    Hypertension Mother    Hyperlipidemia Mother    Heart disease Father        died of MI, late 75s   Hypertension Father    Hyperlipidemia Father    Stroke Father    Hypertension Brother    Hyperlipidemia Brother    Kidney disease Brother        kidney stones  Diabetes Brother        borderline   Hyperlipidemia Brother    Hypertension Brother    Cancer Neg Hx    Colon cancer Neg Hx    Esophageal cancer Neg Hx    Rectal cancer Neg Hx    Stomach cancer Neg Hx      Current Outpatient Medications:    aspirin EC 81 MG tablet, Take 1 tablet (81 mg total) by mouth daily., Disp: 90 tablet, Rfl: 3   Cholecalciferol (VITAMIN D) 50 MCG (2000 UT) CAPS, Take 1 capsule (2,000 Units total) by mouth daily., Disp: 90 capsule, Rfl: 3   Cyanocobalamin (VITAMIN B 12 PO), Take by mouth., Disp: , Rfl:    fish oil-omega-3 fatty acids 1000 MG capsule, Take 2 g by mouth daily., Disp: , Rfl:    levothyroxine (SYNTHROID) 88 MCG tablet, TAKE 1 TABLET BY MOUTH ONCE DAILY BEFORE BREAKFAST, Disp: 30 tablet, Rfl: 0   lovastatin (MEVACOR) 20 MG tablet, TAKE 1 TABLET BY MOUTH AT BEDTIME, Disp: 90 tablet, Rfl: 0   Multiple Vitamin (MULTIVITAMIN WITH  MINERALS) TABS, Take 1 tablet by mouth daily., Disp: , Rfl:    olmesartan-hydrochlorothiazide (BENICAR HCT) 20-12.5 MG tablet, Take 1 tablet by mouth once daily, Disp: 90 tablet, Rfl: 1  Allergies  Allergen Reactions   Pravachol [Pravastatin Sodium]     Fatigue, weight loss     Review of Systems Constitutional: -fever, -chills, -sweats, -unexpected weight change, -decreased appetite, -fatigue Allergy: -sneezing, -itching, -congestion Dermatology: -changing moles, --rash, -lumps ENT: -runny nose, -ear pain, -sore throat, -hoarseness, -sinus pain, -teeth pain, - ringing in ears, -hearing loss, -nosebleeds Cardiology: -chest pain, -palpitations, -swelling, -difficulty breathing when lying flat, -waking up short of breath Respiratory: -cough, -shortness of breath, -difficulty breathing with exercise or exertion, -wheezing, -coughing up blood Gastroenterology: -abdominal pain, -nausea, -vomiting, -diarrhea, -constipation, -blood in stool, -changes in bowel movement, -difficulty swallowing or eating Hematology: -bleeding, -bruising  Musculoskeletal: -joint aches, -muscle aches, -joint swelling, -back pain, -neck pain, -cramping, -changes in gait Ophthalmology: denies vision changes, eye redness, itching, discharge Urology: -burning with urination, -difficulty urinating, -blood in urine, -urinary frequency, -urgency, -incontinence Neurology: -headache, -weakness, -tingling, -numbness, -memory loss, -falls, -dizziness Psychology: -depressed mood, -agitation, -sleep problems Breast/gyn: -breast tenderness, -discharge, -lumps, -vaginal discharge,- irregular periods, -heavy periods     Objective:  BP 122/70   Pulse (!) 58   Wt 132 lb 12.8 oz (60.2 kg)   BMI 20.19 kg/m    Wt Readings from Last 3 Encounters:  02/14/22 132 lb 12.8 oz (60.2 kg)  01/17/21 133 lb (60.3 kg)  06/23/20 137 lb (62.1 kg)   BP Readings from Last 3 Encounters:  02/14/22 122/70  02/12/22 118/68  04/06/21 (!) 148/76    General appearance: alert, no distress, WD/WN, lean white female  Skin: right preauricular region with 2 separate raised flesh colored 24m papules, left ear tragus with vertical fleshy appendage skin that she notes has been unchanged for years, solar keratosis of bilat forearms and hands , scattered cherry hemangiomas of torso Neck: supple, no lymphadenopathy, no thyromegaly, no masses, normal ROM, no bruits  Chest: non tender, normal shape and expansion  Heart: RRR, normal S1, S2, no murmurs  Lungs: CTA bilaterally, no wheezes, rhonchi, or rales  Abdomen: +bs, soft, non tender, non distended, no masses, no hepatomegaly, no splenomegaly, faint bruit over mid aorta of abdomen Back: non tender, normal ROM, no scoliosis  Musculoskeletal: Bony arthritic findings hands and fingers throughout bilaterally, normal ROM  throughout, lower extremities non tender, no obvious deformity, normal ROM throughout  Extremities: no edema, no cyanosis, no clubbing  Pulses: 1+ symmetric, upper  extremities, normal cap refill , pedal pulses 1+ Neurological: alert, oriented x 3, CN2-12 intact, strength normal upper extremities and lower extremities, sensation normal throughout, DTRs 2+ throughout, no cerebellar signs, gait normal  Psychiatric: normal affect, behavior normal, pleasant  Breast, gyn, rectal - declined today   Assessment and Plan :   Encounter Diagnoses  Name Primary?   Encounter for health maintenance examination in adult Yes   Essential hypertension, benign    Hyperlipidemia, unspecified hyperlipidemia type    Vitamin D deficiency    History of Graves' disease    Atherosclerosis of aorta (HCC)    Lung nodule    Smoker    Family history of premature coronary heart disease    Chronic left-sided low back pain with left-sided sciatica    Post-menopausal    Other specified hypothyroidism    Osteopenia, unspecified location     This visit was a preventative care visit, also known as wellness  visit or routine physical.   Topics typically include healthy lifestyle, diet, exercise, preventative care, vaccinations, sick and well care, proper use of emergency dept and after hours care, as well as other concerns.     Recommendations: Continue to return yearly for your annual wellness and preventative care visits.  This gives [place] a chance to discuss healthy lifestyle, exercise, vaccinations, review your chart record, and perform screenings where appropriate.  I recommend you see your eye doctor yearly for routine vision care.  I recommend you see your dentist yearly for routine dental care including hygiene visits twice yearly.   Vaccination recommendations were reviewed Immunization History  Administered Date(s) Administered   DTaP 11/13/2005   Influenza Split 01/29/2014, 01/25/2015   Influenza Whole 02/02/2013   Influenza, High Dose Seasonal PF 01/09/2021   Influenza,inj,Quad PF,6+ Mos 01/28/2019   Influenza-Unspecified 03/08/2016, 01/10/2022   PFIZER(Purple Top)SARS-COV-2 Vaccination 07/15/2019, 08/10/2019, 02/14/2020   PNEUMOCOCCAL CONJUGATE-20 01/09/2022   Pneumococcal Conjugate-13 11/23/2019   Pneumococcal Polysaccharide-23 02/15/2012   Tdap 07/19/2012   Zoster Recombinat (Shingrix) 08/09/2016, 09/10/2016    She will get RSV vaccine at pharmacy  Declines covid booster   Screening for cancer: Colon cancer screening: I reviewed your colonoscopy on file that is up to date from 11/2017  Breast cancer screening: You should perform a self breast exam monthly.   We reviewed recommendations for regular mammograms and breast cancer screening.  Cervical cancer screening: S/p hysterectomy  Skin cancer screening: Check your skin regularly for new changes, growing lesions, or other lesions of concern Come in for evaluation if you have skin lesions of concern.  Lung cancer screening: Pending updated CT chest  We currently don't have screenings for other cancers besides  breast, cervical, colon, and lung cancers.  If you have a strong family history of cancer or have other cancer screening concerns, please let me know.    Bone health: Get at least 150 minutes of aerobic exercise weekly Get weight bearing exercise at least once weekly Bone density test:  A bone density test is an imaging test that uses a type of X-ray to measure the amount of calcium and other minerals in your bones. The test may be used to diagnose or screen you for a condition that causes weak or thin bones (osteoporosis), predict your risk for a broken bone (fracture), or determine how well your osteoporosis treatment is working. The  bone density test is recommended for females 62 and older, or females or males <53 if certain risk factors such as thyroid disease, long term use of steroids such as for asthma or rheumatological issues, vitamin D deficiency, estrogen deficiency, family history of osteoporosis, self or family history of fragility fracture in first degree relative.  Bone density test 01/2021 showing osteopenia    Heart health: Get at least 150 minutes of aerobic exercise weekly Limit alcohol It is important to maintain a healthy blood pressure and healthy cholesterol numbers  Heart disease screening: Screening for heart disease includes screening for blood pressure, fasting lipids, glucose/diabetes screening, BMI height to weight ratio, reviewed of smoking status, physical activity, and diet.    Goals include blood pressure 120/80 or less, maintaining a healthy lipid/cholesterol profile, preventing diabetes or keeping diabetes numbers under good control, not smoking or using tobacco products, exercising most days per week or at least 150 minutes per week of exercise, and eating healthy variety of fruits and vegetables, healthy oils, and avoiding unhealthy food choices like fried food, fast food, high sugar and high cholesterol foods.     CT coronary calcium test 12/2020 with a  Agatston score 338, moderate aortic calcifications, pulmonary nodule as well   Medical care options: I recommend you continue to seek care here first for routine care.  We try really hard to have available appointments Monday through Friday daytime hours for sick visits, acute visits, and physicals.  Urgent care should be used for after hours and weekends for significant issues that cannot wait till the next day.  The emergency department should be used for significant potentially life-threatening emergencies.  The emergency department is expensive, can often have long wait times for less significant concerns, so try to utilize primary care, urgent care, or telemedicine when possible to avoid unnecessary trips to the emergency department.  Virtual visits and telemedicine have been introduced since the pandemic started in 2020, and can be convenient ways to receive medical care.  We offer virtual appointments as well to assist you in a variety of options to seek medical care.   Advanced Directives: I recommend you consider completing a [place] and Living Will.   These documents respect your wishes and help alleviate burdens on your loved ones if you were to become terminally ill or be in a position to need those documents enforced.    You can complete Advanced Directives yourself, have them notarized, then have copies made for our office, for you and for anybody you feel should have them in safe keeping.  Or, you can have an attorney prepare these documents.   If you haven't updated your Last Will and Testament in a while, it may be worthwhile having an attorney prepare these documents together and save on some costs.        Separate significant chronic issues discussed: Premature CAD-continue primary prevention.  I reviewed her cardiology consult from 2018, Dr. Irish Lack.  She had a finding of right bundle branch block, family history of premature CAD, and at that time they felt  like she needed to just have primary prevention and no other testing.  However she did have subsequent visit this past year 2020 had a negative stress test, she also had myocardial perfusion imaging it was normal, 79% ejection fraction, low risk stress test.    History of abdominal aortic aneurysm screen 2018 showing normal tapering, no aneurysm but did have atherosclerotic plaque  Atherosclerosis of the  abdominal aorta-continue statin, primary prevention  Hyperlipidemia-continue current medication  Hypertension - 2020 diagnosis per cardiology, c/t current medication  Hypothyroidism-continue current medication, labs today  Osteopenia, postmenopausal-reviewed the 2022 bone density scan.  Continue Ca+D supplement , aerobic and weight bearing exercise  Vitamin D deficiency-labs today, continue supplement     Tobacco use - quit tobacco!!!  Hue was seen today for fasting med check.  Diagnoses and all orders for this visit:  Encounter for health maintenance examination in adult -     Comprehensive metabolic panel -     CBC -     Lipid panel -     Hemoglobin A1c -     TSH + free T4 -     VITAMIN D 25 Hydroxy (Vit-D Deficiency, Fractures) -     Cancel: POCT Urinalysis DIP (Proadvantage Device)  Essential hypertension, benign  Hyperlipidemia, unspecified hyperlipidemia type -     Lipid panel  Vitamin D deficiency -     VITAMIN D 25 Hydroxy (Vit-D Deficiency, Fractures)  History of Graves' disease -     TSH + free T4  Atherosclerosis of aorta (HCC)  Lung nodule -     Cancel: CT CHEST NODULE FOLLOW UP LOW DOSE W/O; Future -     CT CHEST WO CONTRAST; Future  Smoker  Family history of premature coronary heart disease  Chronic left-sided low back pain with left-sided sciatica  Post-menopausal  Other specified hypothyroidism  Osteopenia, unspecified location   Follow-up pending labs, yearly for physical

## 2022-02-15 ENCOUNTER — Other Ambulatory Visit: Payer: Self-pay | Admitting: Medical

## 2022-02-15 LAB — COMPREHENSIVE METABOLIC PANEL
ALT: 8 IU/L (ref 0–32)
AST: 14 IU/L (ref 0–40)
Albumin/Globulin Ratio: 2.2 (ref 1.2–2.2)
Albumin: 4.6 g/dL (ref 3.9–4.9)
Alkaline Phosphatase: 45 IU/L (ref 44–121)
BUN/Creatinine Ratio: 19 (ref 12–28)
BUN: 16 mg/dL (ref 8–27)
Bilirubin Total: 0.4 mg/dL (ref 0.0–1.2)
CO2: 25 mmol/L (ref 20–29)
Calcium: 9.7 mg/dL (ref 8.7–10.3)
Chloride: 101 mmol/L (ref 96–106)
Creatinine, Ser: 0.83 mg/dL (ref 0.57–1.00)
Globulin, Total: 2.1 g/dL (ref 1.5–4.5)
Glucose: 95 mg/dL (ref 70–99)
Potassium: 4.7 mmol/L (ref 3.5–5.2)
Sodium: 140 mmol/L (ref 134–144)
Total Protein: 6.7 g/dL (ref 6.0–8.5)
eGFR: 77 mL/min/{1.73_m2} (ref >59)

## 2022-02-15 LAB — CBC
Hematocrit: 37.7 % (ref 34.0–46.6)
Hemoglobin: 12.6 g/dL (ref 11.1–15.9)
MCH: 31.5 pg (ref 26.6–33.0)
MCHC: 33.4 g/dL (ref 31.5–35.7)
MCV: 94 fL (ref 79–97)
Platelets: 288 10*3/uL (ref 150–450)
RBC: 4 x10E6/uL (ref 3.77–5.28)
RDW: 12.2 % (ref 11.7–15.4)
WBC: 7.2 10*3/uL (ref 3.4–10.8)

## 2022-02-15 LAB — HEMOGLOBIN A1C
Est. average glucose Bld gHb Est-mCnc: 123 mg/dL
Hgb A1c MFr Bld: 5.9 % — ABNORMAL HIGH (ref 4.8–5.6)

## 2022-02-15 LAB — LIPID PANEL
Chol/HDL Ratio: 1.8 ratio (ref 0.0–4.4)
Cholesterol, Total: 168 mg/dL (ref 100–199)
HDL: 95 mg/dL (ref >39)
LDL Chol Calc (NIH): 61 mg/dL (ref 0–99)
Triglycerides: 61 mg/dL (ref 0–149)
VLDL Cholesterol Cal: 12 mg/dL (ref 5–40)

## 2022-02-15 LAB — TSH+FREE T4
Free T4: 1.71 ng/dL (ref 0.82–1.77)
TSH: 0.999 u[IU]/mL (ref 0.450–4.500)

## 2022-02-15 LAB — VITAMIN D 25 HYDROXY (VIT D DEFICIENCY, FRACTURES): Vit D, 25-Hydroxy: 24.9 ng/mL — ABNORMAL LOW (ref 30.0–100.0)

## 2022-02-15 MED ORDER — VITAMIN D 50 MCG (2000 UT) PO CAPS: 1.0000 | ORAL_CAPSULE | Freq: Every day | ORAL | 3 refills | Status: DC

## 2022-02-15 MED ORDER — LEVOTHYROXINE SODIUM 88 MCG PO TABS: 88.0000 ug | ORAL_TABLET | Freq: Every day | ORAL | 1 refills | Status: DC

## 2022-02-15 MED ORDER — OLMESARTAN MEDOXOMIL-HCTZ 20-12.5 MG PO TABS: 1.0000 | ORAL_TABLET | Freq: Every day | ORAL | 3 refills | Status: DC

## 2022-02-25 NOTE — Procedures (Signed)
Lumbosacral Transforaminal Epidural Steroid Injection - Sub-Pedicular Approach with Fluoroscopic Guidance  Patient: Jodi Flynn      Date of Birth: 04/18/1956 MRN: [id number] PCP: Carlena Hurl, PA-C      Visit Date: 02/12/2022   Universal Protocol:    Date/Time: 02/12/2022  Consent Given By: the patient  Position: PRONE  Additional Comments: Vital signs were monitored before and after the procedure. Patient was prepped and draped in the usual sterile fashion. The correct patient, procedure, and site was verified.   Injection Procedure Details:   Procedure diagnoses: Lumbar radiculopathy [M54.16]    Meds Administered:  Meds ordered this encounter  Medications   DISCONTD: methylPREDNISolone acetate (DEPO-MEDROL) injection 40 mg    Laterality: Left  Location/Site: L4  Needle:5.0 in., 22 ga.  Short bevel or Quincke spinal needle  Needle Placement: Transforaminal  Findings:    -Comments: Excellent flow of contrast along the nerve, nerve root and into the epidural space.  Procedure Details: After squaring off the end-plates to get a true AP view, the C-arm was positioned so that an oblique view of the foramen as noted above was visualized. The target area is just inferior to the "nose of the scotty dog" or sub pedicular. The soft tissues overlying this structure were infiltrated with 2-3 ml. of 1% Lidocaine without Epinephrine.  The spinal needle was inserted toward the target using a "trajectory" view along the fluoroscope beam.  Under AP and lateral visualization, the needle was advanced so it did not puncture dura and was located close the 6 O'Clock position of the pedical in AP tracterory. Biplanar projections were used to confirm position. Aspiration was confirmed to be negative for CSF and/or blood. A 1-2 ml. volume of Isovue-250 was injected and flow of contrast was noted at each level. Radiographs were obtained for documentation purposes.   After attaining  the desired flow of contrast documented above, a 0.5 to 1.0 ml test dose of 0.25% Marcaine was injected into each respective transforaminal space.  The patient was observed for 90 seconds post injection.  After no sensory deficits were reported, and normal lower extremity motor function was noted,   the above injectate was administered so that equal amounts of the injectate were placed at each foramen (level) into the transforaminal epidural space.   Additional Comments:  The patient tolerated the procedure well Dressing: 2 x 2 sterile gauze and Band-Aid    Post-procedure details: Patient was observed during the procedure. Post-procedure instructions were reviewed.  Patient left the clinic in stable condition.

## 2022-02-25 NOTE — Progress Notes (Signed)
Jodi Flynn - 66 y.o. female MRN [id number]  Date of birth: [birth year]  Office Visit Note: Visit Date: 02/12/2022 PCP: Carlena Hurl, PA-C Referred by: Lorine Bears, NP  Subjective: Chief Complaint  Patient presents with   Lower Back - Pain   HPI:  Jodi Flynn is a 66 y.o. female who comes in today for planned repeat Left L4-5  Lumbar Transforaminal epidural steroid injection with fluoroscopic guidance.  The patient has failed conservative care including home exercise, medications, time and activity modification.  This injection will be diagnostic and hopefully therapeutic.  Please see requesting physician notes for further details and justification. Patient received more than 50% pain relief from prior injection.   Referring: Benita Stabile, PA-C   ROS Otherwise per HPI.  Assessment & Plan: Visit Diagnoses:    ICD-10-CM   1. Lumbar radiculopathy  M54.16 XR C-ARM NO REPORT    Epidural Steroid injection    DISCONTINUED: methylPREDNISolone acetate (DEPO-MEDROL) injection 40 mg      Plan: No additional findings.   Meds & Orders:  Meds ordered this encounter  Medications   DISCONTD: methylPREDNISolone acetate (DEPO-MEDROL) injection 40 mg    Orders Placed This Encounter  Procedures   XR C-ARM NO REPORT   Epidural Steroid injection    Follow-up: Return for visit to requesting provider as needed.   Procedures: No procedures performed  Lumbosacral Transforaminal Epidural Steroid Injection - Sub-Pedicular Approach with Fluoroscopic Guidance  Patient: Jodi Flynn      Date of Birth: 02-20-1956 MRN: [id number] PCP: Carlena Hurl, PA-C      Visit Date: 02/12/2022   Universal Protocol:    Date/Time: 02/12/2022  Consent Given By: the patient  Position: PRONE  Additional Comments: Vital signs were monitored before and after the procedure. Patient was prepped and draped in the usual sterile fashion. The correct patient, procedure, and site was  verified.   Injection Procedure Details:   Procedure diagnoses: Lumbar radiculopathy [M54.16]    Meds Administered:  Meds ordered this encounter  Medications   DISCONTD: methylPREDNISolone acetate (DEPO-MEDROL) injection 40 mg    Laterality: Left  Location/Site: L4  Needle:5.0 in., 22 ga.  Short bevel or Quincke spinal needle  Needle Placement: Transforaminal  Findings:    -Comments: Excellent flow of contrast along the nerve, nerve root and into the epidural space.  Procedure Details: After squaring off the end-plates to get a true AP view, the C-arm was positioned so that an oblique view of the foramen as noted above was visualized. The target area is just inferior to the "nose of the scotty dog" or sub pedicular. The soft tissues overlying this structure were infiltrated with 2-3 ml. of 1% Lidocaine without Epinephrine.  The spinal needle was inserted toward the target using a "trajectory" view along the fluoroscope beam.  Under AP and lateral visualization, the needle was advanced so it did not puncture dura and was located close the 6 O'Clock position of the pedical in AP tracterory. Biplanar projections were used to confirm position. Aspiration was confirmed to be negative for CSF and/or blood. A 1-2 ml. volume of Isovue-250 was injected and flow of contrast was noted at each level. Radiographs were obtained for documentation purposes.   After attaining the desired flow of contrast documented above, a 0.5 to 1.0 ml test dose of 0.25% Marcaine was injected into each respective transforaminal space.  The patient was observed for 90 seconds post injection.  After no sensory deficits were  reported, and normal lower extremity motor function was noted,   the above injectate was administered so that equal amounts of the injectate were placed at each foramen (level) into the transforaminal epidural space.   Additional Comments:  The patient tolerated the procedure well Dressing: 2 x  2 sterile gauze and Band-Aid    Post-procedure details: Patient was observed during the procedure. Post-procedure instructions were reviewed.  Patient left the clinic in stable condition.    Clinical History: MRI LUMBAR SPINE WITHOUT CONTRAST     TECHNIQUE:  Multiplanar, multisequence MR imaging of the lumbar spine was  performed. No intravenous contrast was administered.     COMPARISON:  None.     FINDINGS:  Segmentation:  Standard.     Alignment:  Physiologic.     Vertebrae: No fracture, evidence of discitis, or bone lesion.  Congenitally short pedicles, which narrow the AP diameter of the  spinal canal.     Conus medullaris and cauda equina: Conus extends to the T12-L1  level. Conus and cauda equina appear normal.     Paraspinal and other soft tissues: Right renal cyst. Otherwise  negative.     Disc levels:     T12-L1: No significant disc bulge. No spinal canal stenosis or  neural foraminal narrowing.     L1-L2: No significant disc bulge. Mild right facet arthropathy. No  spinal canal stenosis. Mild bilateral neural foraminal narrowing.     L2-L3: Minimal disc bulge. Moderate facet arthropathy. No spinal  canal stenosis. No neural foraminal narrowing.     L3-L4: Mild disc bulge. Mild facet arthropathy. No spinal canal  stenosis. Mild right neural foraminal narrowing.     L4-L5: Broad-based disc bulge. Moderate left and mild right facet  arthropathy. No spinal canal stenosis. Moderate bilateral neural  foraminal narrowing.     L5-S1: Mild disc bulge. Mild facet arthropathy. No spinal canal  stenosis or neural foraminal narrowing.     IMPRESSION:  1. L4-L5 moderate bilateral neural foraminal narrowing.  2. L1-L2 mild bilateral neural foraminal narrowing.  3. L3-L4 mild right neural foraminal narrowing.        Electronically Signed    By: Merilyn Baba M.D.    On: 02/20/2021 02:41     Objective:  VS:  HT:    WT:   BMI:     BP:118/68  HR:70bpm   TEMP: ( )  RESP:  Physical Exam Vitals and nursing note reviewed.  Constitutional:      General: She is not in acute distress.    Appearance: Normal appearance. She is not ill-appearing.  HENT:     Head: Normocephalic and atraumatic.     Right Ear: External ear normal.     Left Ear: External ear normal.  Eyes:     Extraocular Movements: Extraocular movements intact.  Cardiovascular:     Rate and Rhythm: Normal rate.     Pulses: Normal pulses.  Pulmonary:     Effort: Pulmonary effort is normal. No respiratory distress.  Abdominal:     General: There is no distension.     Palpations: Abdomen is soft.  Musculoskeletal:        General: Tenderness present.     Cervical back: Neck supple.     Right lower leg: No edema.     Left lower leg: No edema.     Comments: Patient has good distal strength with no pain over the greater trochanters.  No clonus or focal weakness.  Skin:  Findings: No erythema, lesion or rash.  Neurological:     General: No focal deficit present.     Mental Status: She is alert and oriented to person, place, and time.     Sensory: No sensory deficit.     Motor: No weakness or abnormal muscle tone.     Coordination: Coordination normal.  Psychiatric:        Mood and Affect: Mood normal.        Behavior: Behavior normal.      Imaging: No results found.

## 2022-03-13 ENCOUNTER — Encounter: Payer: Self-pay | Admitting: Medical

## 2022-03-15 ENCOUNTER — Ambulatory Visit
Admission: RE | Admit: 2022-03-15 | Discharge: 2022-03-15 | Disposition: A | Discharge status: Home / Self Care | Payer: No Typology Code available for payment source | Source: Ambulatory Visit | Attending: Medical | Admitting: Medical

## 2022-03-15 DIAGNOSIS — R911 Solitary pulmonary nodule: Secondary | ICD-10-CM

## 2022-03-18 ENCOUNTER — Other Ambulatory Visit: Payer: Self-pay | Admitting: Medical

## 2022-03-18 DIAGNOSIS — R911 Solitary pulmonary nodule: Secondary | ICD-10-CM

## 2022-04-12 ENCOUNTER — Other Ambulatory Visit: Payer: Self-pay | Admitting: Medical

## 2022-08-15 DIAGNOSIS — R911 Solitary pulmonary nodule: Secondary | ICD-10-CM

## 2022-08-15 HISTORY — DX: Solitary pulmonary nodule: R91.1

## 2022-08-17 ENCOUNTER — Encounter: Payer: Self-pay | Admitting: Internal Medicine

## 2022-09-12 ENCOUNTER — Other Ambulatory Visit: Payer: Self-pay | Admitting: Medical

## 2022-10-24 ENCOUNTER — Other Ambulatory Visit: Payer: Self-pay | Admitting: Medical

## 2022-10-24 NOTE — Telephone Encounter (Signed)
Pt called to check on status for her lovastatin refill?

## 2022-11-06 ENCOUNTER — Encounter: Payer: Self-pay | Admitting: Medical

## 2022-11-06 ENCOUNTER — Ambulatory Visit: Payer: No Typology Code available for payment source | Admitting: Medical

## 2022-11-06 VITALS — BP 130/80 | HR 79 | Ht 64.0 in | Wt 136.0 lb

## 2022-11-06 DIAGNOSIS — M8580 Other specified disorders of bone density and structure, unspecified site: Secondary | ICD-10-CM

## 2022-11-06 DIAGNOSIS — R7301 Impaired fasting glucose: Secondary | ICD-10-CM

## 2022-11-06 DIAGNOSIS — L989 Disorder of the skin and subcutaneous tissue, unspecified: Secondary | ICD-10-CM

## 2022-11-06 DIAGNOSIS — E785 Hyperlipidemia, unspecified: Secondary | ICD-10-CM

## 2022-11-06 DIAGNOSIS — I10 Essential (primary) hypertension: Secondary | ICD-10-CM

## 2022-11-06 DIAGNOSIS — E038 Other specified hypothyroidism: Secondary | ICD-10-CM

## 2022-11-06 DIAGNOSIS — E559 Vitamin D deficiency, unspecified: Secondary | ICD-10-CM | POA: Diagnosis not present

## 2022-11-06 DIAGNOSIS — F17200 Nicotine dependence, unspecified, uncomplicated: Secondary | ICD-10-CM

## 2022-11-06 DIAGNOSIS — L821 Other seborrheic keratosis: Secondary | ICD-10-CM

## 2022-11-06 DIAGNOSIS — I700 Atherosclerosis of aorta: Secondary | ICD-10-CM

## 2022-11-06 MED ORDER — CLOTRIMAZOLE-BETAMETHASONE 1-0.05 % EX CREA: 1.0000 | TOPICAL_CREAM | Freq: Every day | CUTANEOUS | 0 refills | Status: DC
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
## 2022-11-07 ENCOUNTER — Other Ambulatory Visit: Payer: Self-pay | Admitting: Medical

## 2022-11-07 LAB — VITAMIN D 25 HYDROXY (VIT D DEFICIENCY, FRACTURES): Vit D, 25-Hydroxy: 36.1 ng/mL (ref 30.0–100.0)

## 2022-11-07 LAB — TSH+FREE T4
Free T4: 1.54 ng/dL (ref 0.82–1.77)
TSH: 2.74 u[IU]/mL (ref 0.450–4.500)

## 2022-11-07 LAB — HEMOGLOBIN A1C
Est. average glucose Bld gHb Est-mCnc: 120 mg/dL
Hgb A1c MFr Bld: 5.8 % — ABNORMAL HIGH (ref 4.8–5.6)

## 2022-11-07 MED ORDER — LOVASTATIN 20 MG PO TABS: 20.0000 mg | ORAL_TABLET | Freq: Every day | ORAL | 3 refills | Status: DC

## 2022-11-07 MED ORDER — VITAMIN D (ERGOCALCIFEROL) 1.25 MG (50000 UNIT) PO CAPS: 50000.0000 [IU] | ORAL_CAPSULE | ORAL | 3 refills | Status: DC

## 2022-11-07 MED ORDER — LEVOTHYROXINE SODIUM 88 MCG PO TABS: 88.0000 ug | ORAL_TABLET | Freq: Every day | ORAL | 3 refills | Status: DC

## 2022-11-07 MED ORDER — OLMESARTAN MEDOXOMIL-HCTZ 20-12.5 MG PO TABS: 1.0000 | ORAL_TABLET | Freq: Every day | ORAL | 3 refills | Status: DC

## 2022-11-07 NOTE — Progress Notes (Signed)
Results sent through MyChart

## 2023-01-29 LAB — HM MAMMOGRAPHY

## 2023-01-31 ENCOUNTER — Encounter: Payer: Self-pay | Admitting: Internal Medicine

## 2023-10-24 ENCOUNTER — Ambulatory Visit: Admitting: Medical

## 2023-10-24 ENCOUNTER — Encounter: Payer: Self-pay | Admitting: Medical

## 2023-10-24 VITALS — BP 110/68 | HR 68 | Ht 64.5 in | Wt 139.0 lb

## 2023-10-24 DIAGNOSIS — R9389 Abnormal findings on diagnostic imaging of other specified body structures: Secondary | ICD-10-CM

## 2023-10-24 DIAGNOSIS — I10 Essential (primary) hypertension: Secondary | ICD-10-CM | POA: Diagnosis not present

## 2023-10-24 DIAGNOSIS — R911 Solitary pulmonary nodule: Secondary | ICD-10-CM

## 2023-10-24 DIAGNOSIS — E038 Other specified hypothyroidism: Secondary | ICD-10-CM

## 2023-10-24 DIAGNOSIS — E559 Vitamin D deficiency, unspecified: Secondary | ICD-10-CM | POA: Diagnosis not present

## 2023-10-24 DIAGNOSIS — Z1211 Encounter for screening for malignant neoplasm of colon: Secondary | ICD-10-CM

## 2023-10-24 DIAGNOSIS — E785 Hyperlipidemia, unspecified: Secondary | ICD-10-CM | POA: Diagnosis not present

## 2023-10-24 DIAGNOSIS — Z780 Asymptomatic menopausal state: Secondary | ICD-10-CM

## 2023-10-24 DIAGNOSIS — I700 Atherosclerosis of aorta: Secondary | ICD-10-CM | POA: Diagnosis not present

## 2023-10-24 DIAGNOSIS — Z0000 Encounter for general adult medical examination without abnormal findings: Secondary | ICD-10-CM | POA: Diagnosis not present

## 2023-10-24 DIAGNOSIS — R7301 Impaired fasting glucose: Secondary | ICD-10-CM

## 2023-10-24 LAB — LIPID PANEL

## 2023-10-24 LAB — TSH

## 2023-10-24 LAB — COMPREHENSIVE METABOLIC PANEL WITH GFR

## 2023-10-24 LAB — VITAMIN D 25 HYDROXY (VIT D DEFICIENCY, FRACTURES)

## 2023-10-24 NOTE — Progress Notes (Addendum)
 Subjective:   HPI  Jodi Flynn is a 68 y.o. female who presents for Chief Complaint  Patient presents with   Annual Exam    Labs done this AM    Patient Care Team: Avante Carneiro, Alm GORMAN RIGGERS as PCP - General (Family Medicine) Eldonna Novel, MD as Consulting Physician (Physical Medicine and Rehabilitation)  Dr. Victory Brand, gastroenterology Dr. Lonni Poli, orthopedics Dr. Candyce Reek, cardiology Sees dentist Sees eye doctor  Concerns: Planning to retire this December.  Still smokes some, has cut way back.  Doing fine, no particular issues.    Hypothyroidism-compliant levothyroxine  88 mcg daily  Hyperlipidemia-compliant with lovastatin  20 mg daily without complaint, compliant with fish oil  Hypertension-compliant with olmesartan  HCT 20/12.5 mg daily  Vitamin D  deficiency-compliant with weekly vitamin D  prescription  Reviewed their medical, surgical, family, social, medication, and allergy history and updated chart as appropriate.  Past Medical History:  Diagnosis Date   Allergy    Family history of premature coronary heart disease    Grave's disease    History of cervical cancer    s/p hysterectomy   History of renal stone    Hx of colonic polyps 2007   Nodule of lower lobe of lung 08/15/2022   4mm- done by [facility]   Osteopenia    bone density scans 01/2021, 2020,  2015   Tobacco use disorder    Wears glasses     Past Surgical History:  Procedure Laterality Date   ABDOMINAL HYSTERECTOMY     ovaries intact; age 68yo   COLONOSCOPY  age 43, 12/12/05   Dr. Alm Gander   COLONOSCOPY  11/2017   normal; Dr. Victory Brand   COLPOSCOPY     age 45   VEIN SURGERY  1/12   laser ablation left great saphenous vein , stab phlebectomy of multiple varicosities, Dr. Oris     Social History   Socioeconomic History   Marital status: Widowed    Spouse name: Not on file   Number of children: 2   Years of education: Not on file   Highest  education level: Not on file  Occupational History   Occupation: Teacher, music for [employer]    Employer: [employer]  Tobacco Use   Smoking status: Some Days    Current packs/day: 0.50    Average packs/day: 0.5 packs/day for 42.0 years (21.0 ttl pk-yrs)    Types: Cigarettes   Smokeless tobacco: Never  Vaping Use   Vaping status: Never Used  Substance and Sexual Activity   Alcohol use: Not Currently    Alcohol/week: 7.0 standard drinks of alcohol    Types: 5 Glasses of wine, 2 Standard drinks or equivalent per week   Drug use: No   Sexual activity: Yes  Other Topics Concern   Not on file  Social History Narrative   Widowed.  2 children, walks for exercise, hobbies: puzzles.  2 prior boyfriends committed suicide, one in front of her.  Works at [employer].  09/2023   Social Drivers of Health   Financial Resource Strain: Low Risk  (10/24/2023)   Overall Financial Resource Strain (CARDIA)    Difficulty of Paying Living Expenses: Not very hard  Food Insecurity: No Food Insecurity (10/24/2023)   Hunger Vital Sign    Worried About Running Out of Food in the Last Year: Never true    Ran Out of Food in the Last Year: Never true  Transportation Needs: No Transportation Needs (10/24/2023)   PRAPARE -  Administrator, Civil Service (Medical): No    Lack of Transportation (Non-Medical): No  Physical Activity: Insufficiently Active (10/24/2023)   Exercise Vital Sign    Days of Exercise per Week: 5 days    Minutes of Exercise per Session: 20 min  Stress: No Stress Concern Present (10/24/2023)   [employer] of Occupational Health - Occupational Stress Questionnaire    Feeling of Stress: Not at all  Social Connections: Socially Isolated (10/24/2023)   Social Connection and Isolation Panel    Frequency of Communication with Friends and Family: Three times a week    Frequency of Social Gatherings with Friends and Family: Twice a week    Attends Religious Services:  Patient declined    Database administrator or Organizations: No    Attends Engineer, structural: Patient declined    Marital Status: Widowed  Intimate Partner Violence: Patient Declined (10/24/2023)   Humiliation, Afraid, Rape, and Kick questionnaire    Fear of Current or Ex-Partner: Patient declined    Emotionally Abused: Patient declined    Physically Abused: Patient declined    Sexually Abused: Patient declined    Family History  Problem Relation Age of Onset   COPD Mother        respiratory failure   Kidney disease Mother    Hypertension Mother    Hyperlipidemia Mother    Heart disease Father        died of MI, late 50s   Hypertension Father    Hyperlipidemia Father    Stroke Father    Hypertension Brother    Hyperlipidemia Brother    Kidney disease Brother        kidney stones   Diabetes Brother        borderline   Hyperlipidemia Brother    Hypertension Brother    Cancer Neg Hx    Colon cancer Neg Hx    Esophageal cancer Neg Hx    Rectal cancer Neg Hx    Stomach cancer Neg Hx      Current Outpatient Medications:    Cyanocobalamin (VITAMIN B 12 PO), Take by mouth., Disp: , Rfl:    fish oil-omega-3 fatty acids 1000 MG capsule, Take 2 g by mouth daily., Disp: , Rfl:    Multiple Vitamin (MULTIVITAMIN WITH MINERALS) TABS, Take 1 tablet by mouth daily., Disp: , Rfl:    levothyroxine  (SYNTHROID ) 88 MCG tablet, Take 1 tablet (88 mcg total) by mouth daily before breakfast., Disp: 90 tablet, Rfl: 3   lovastatin  (MEVACOR ) 20 MG tablet, Take 1 tablet (20 mg total) by mouth at bedtime., Disp: 90 tablet, Rfl: 3   olmesartan -hydrochlorothiazide (BENICAR  HCT) 20-12.5 MG tablet, Take 1 tablet by mouth daily., Disp: 90 tablet, Rfl: 3   Vitamin D , Ergocalciferol , (DRISDOL ) 1.25 MG (50000 UNIT) CAPS capsule, Take 1 capsule (50,000 Units total) by mouth every 7 (seven) days., Disp: 12 capsule, Rfl: 3  Allergies  Allergen Reactions   Atorvastatin     myalgias   Pravachol   [Pravastatin  Sodium]     Fatigue, weight loss    Review of Systems  Constitutional:  Negative for chills, fever, malaise/fatigue and weight loss.  HENT:  Negative for congestion, ear pain, hearing loss, sore throat and tinnitus.   Eyes:  Negative for blurred vision, pain and redness.  Respiratory:  Negative for cough, hemoptysis and shortness of breath.   Cardiovascular:  Negative for chest pain, palpitations, orthopnea, claudication and leg swelling.  Gastrointestinal:  Negative for abdominal  pain, blood in stool, constipation, diarrhea, nausea and vomiting.  Genitourinary:  Negative for dysuria, flank pain, frequency, hematuria and urgency.  Musculoskeletal:  Negative for falls, joint pain and myalgias.  Skin:  Negative for itching and rash.  Neurological:  Negative for dizziness, tingling, speech change, weakness and headaches.  Endo/Heme/Allergies:  Negative for polydipsia. Does not bruise/bleed easily.  Psychiatric/Behavioral:  Negative for depression and memory loss. The patient is not nervous/anxious and does not have insomnia.         Objective:  BP 110/68   Pulse 68   Ht 5' 4.5 (1.638 m)   Wt 139 lb (63 kg)   SpO2 98%   BMI 23.49 kg/m    Wt Readings from Last 3 Encounters:  10/24/23 139 lb (63 kg)  11/06/22 136 lb (61.7 kg)  02/14/22 132 lb 12.8 oz (60.2 kg)   BP Readings from Last 3 Encounters:  10/24/23 110/68  11/06/22 130/80  02/14/22 122/70   General appearance: alert, no distress, WD/WN, lean white female  Skin: right preauricular region with 2 separate raised flesh colored 3mm papules, left ear tragus with vertical fleshy appendage skin that she notes has been unchanged for years, solar keratosis of bilat forearms and hands , scattered cherry hemangiomas of torso Neck: supple, no lymphadenopathy, no thyromegaly, no masses, normal ROM, no bruits  Chest: non tender, normal shape and expansion  Heart: RRR, normal S1, S2, no murmurs  Lungs: CTA bilaterally,  no wheezes, rhonchi, or rales  Abdomen: +bs, soft, non tender, non distended, no masses, no hepatomegaly, no splenomegaly, faint bruit over mid aorta of abdomen Back: non tender, normal ROM, no scoliosis  Musculoskeletal: Bony arthritic findings hands and fingers throughout bilaterally, normal ROM throughout, lower extremities non tender, no obvious deformity, normal ROM throughout  Extremities: no edema, no cyanosis, no clubbing  Pulses: 1+ symmetric, upper  extremities, normal cap refill , pedal pulses 1+ Neurological: alert, oriented x 3, CN2-12 intact, strength normal upper extremities and lower extremities, sensation normal throughout, DTRs 2+ throughout, no cerebellar signs, gait normal  Psychiatric: normal affect, behavior normal, pleasant  Breast, gyn, rectal - declined today    Assessment and Plan :   Encounter Diagnoses  Name Primary?   Encounter for health maintenance examination in adult    Vitamin D  deficiency    Hyperlipidemia, unspecified hyperlipidemia type    Essential hypertension, benign    Atherosclerosis of aorta (HCC)    Other specified hypothyroidism    Post-menopausal    Impaired fasting blood sugar    Solitary pulmonary nodule    Abnormal CT of the chest    Screening for colon cancer Yes    This visit was a preventative care visit, also known as wellness visit or routine physical.   Topics typically include healthy lifestyle, diet, exercise, preventative care, vaccinations, sick and well care, proper use of emergency dept and after hours care, as well as other concerns.     Recommendations: Continue to return yearly for your annual wellness and preventative care visits.  This gives us  a chance to discuss healthy lifestyle, exercise, vaccinations, review your chart record, and perform screenings where appropriate.  I recommend you see your eye doctor yearly for routine vision care.  I recommend you see your dentist yearly for routine dental care including  hygiene visits twice yearly.   Vaccination recommendations were reviewed Immunization History  Administered Date(s) Administered   DTaP 11/13/2005   Influenza Split 01/29/2014, 01/25/2015   Influenza Whole 02/02/2013  Influenza, High Dose Seasonal PF 01/09/2021   Influenza,inj,Quad PF,6+ Mos 01/28/2019   Influenza-Unspecified 03/08/2016, 01/10/2022   PFIZER(Purple Top)SARS-COV-2 Vaccination 07/15/2019, 08/10/2019, 02/14/2020   PNEUMOCOCCAL CONJUGATE-20 01/09/2022   Pneumococcal Conjugate-13 11/23/2019   Pneumococcal Polysaccharide-23 02/15/2012   Tdap 07/19/2012, 08/23/2022   Zoster Recombinant(Shingrix ) 08/09/2016, 09/10/2016   Declines covid booster   Screening for cancer: Colon cancer screening: I reviewed your colonoscopy on file that is up to date from 11/2017.  Repeat in 2029. Will send home with FIT test today for intermediate screening  Breast cancer screening: You should perform a self breast exam monthly.   We reviewed recommendations for regular mammograms and breast cancer screening.  Cervical cancer screening: S/p hysterectomy  Skin cancer screening: Check your skin regularly for new changes, growing lesions, or other lesions of concern Come in for evaluation if you have skin lesions of concern.  Lung cancer screening: Plan for repeat chest CT fall 2025  We currently don't have screenings for other cancers besides breast, cervical, colon, and lung cancers.  If you have a strong family history of cancer or have other cancer screening concerns, please let me know.    Bone health: Get at least 150 minutes of aerobic exercise weekly Get weight bearing exercise at least once weekly Bone density test:  A bone density test is an imaging test that uses a type of X-ray to measure the amount of calcium  and other minerals in your bones. The test may be used to diagnose or screen you for a condition that causes weak or thin bones (osteoporosis), predict your risk for  a broken bone (fracture), or determine how well your osteoporosis treatment is working. The bone density test is recommended for females 65 and older, or females or males <65 if certain risk factors such as thyroid disease, long term use of steroids such as for asthma or rheumatological issues, vitamin D  deficiency, estrogen deficiency, family history of osteoporosis, self or family history of fragility fracture in first degree relative.  Bone density test 01/2021 showing osteopenia  Repeat bone density test ordered   Heart health: Get at least 150 minutes of aerobic exercise weekly Limit alcohol It is important to maintain a healthy blood pressure and healthy cholesterol numbers  Heart disease screening: Screening for heart disease includes screening for blood pressure, fasting lipids, glucose/diabetes screening, BMI height to weight ratio, reviewed of smoking status, physical activity, and diet.    Goals include blood pressure 120/80 or less, maintaining a healthy lipid/cholesterol profile, preventing diabetes or keeping diabetes numbers under good control, not smoking or using tobacco products, exercising most days per week or at least 150 minutes per week of exercise, and eating healthy variety of fruits and vegetables, healthy oils, and avoiding unhealthy food choices like fried food, fast food, high sugar and high cholesterol foods.     CT coronary calcium  test 12/2020 with a Agatston score 338, moderate aortic calcifications, pulmonary nodule as well   Medical care options: I recommend you continue to seek care here first for routine care.  We try really hard to have available appointments Monday through Friday daytime hours for sick visits, acute visits, and physicals.  Urgent care should be used for after hours and weekends for significant issues that cannot wait till the next day.  The emergency department should be used for significant potentially life-threatening emergencies.  The  emergency department is expensive, can often have long wait times for less significant concerns, so try to utilize primary care, urgent  care, or telemedicine when possible to avoid unnecessary trips to the emergency department.  Virtual visits and telemedicine have been introduced since the pandemic started in 2020, and can be convenient ways to receive medical care.  We offer virtual appointments as well to assist you in a variety of options to seek medical care.   Advanced Directives: I recommend you consider completing a Health Care Power of Attorney and Living Will.   These documents respect your wishes and help alleviate burdens on your loved ones if you were to become terminally ill or be in a position to need those documents enforced.    You can complete Advanced Directives yourself, have them notarized, then have copies made for our office, for you and for anybody you feel should have them in safe keeping.  Or, you can have an attorney prepare these documents.   If you haven't updated your Last Will and Testament in a while, it may be worthwhile having an attorney prepare these documents together and save on some costs.        Separate significant chronic issues discussed: Atherosclerosis of the abdominal aorta-continue statin Mevacor  20mg  daily for primary prevention  Hyperlipidemia-continue current medication Mevacor  20mg  daily. Prior adverse reaction to atorvastatin and pravachol   Hypertension - continue Olmesartan  HCT 20/12.5mg  daily  Hypothyroidism-continue current medication Levothyroxine  88mcg daily, labs today  Osteopenia, postmenopausal-reviewed the 2022 bone density scan.  Continue Ca+D supplement , aerobic and weight bearing exercise  Vitamin D  deficiency-labs today, continue supplement Vit D 50,000 weekly     Tobacco use - quit tobacco!!!  Alley was seen today for annual exam.  Diagnoses and all orders for this visit:  Screening for colon cancer -     Fecal  occult blood, imunochemical  Encounter for health maintenance examination in adult -     CBC -     Comprehensive metabolic panel with GFR -     Lipid panel -     TSH -     T4, free -     Hemoglobin A1c -     VITAMIN D  25 Hydroxy (Vit-D Deficiency, Fractures)  Vitamin D  deficiency -     VITAMIN D  25 Hydroxy (Vit-D Deficiency, Fractures)  Hyperlipidemia, unspecified hyperlipidemia type -     Lipid panel  Essential hypertension, benign  Atherosclerosis of aorta (HCC)  Other specified hypothyroidism -     TSH -     T4, free  Post-menopausal  Impaired fasting blood sugar -     Hemoglobin A1c  Solitary pulmonary nodule -     CT Chest W Contrast; Future  Abnormal CT of the chest  Other orders -     levothyroxine  (SYNTHROID ) 88 MCG tablet; Take 1 tablet (88 mcg total) by mouth daily before breakfast. -     lovastatin  (MEVACOR ) 20 MG tablet; Take 1 tablet (20 mg total) by mouth at bedtime. -     olmesartan -hydrochlorothiazide (BENICAR  HCT) 20-12.5 MG tablet; Take 1 tablet by mouth daily. -     Vitamin D , Ergocalciferol , (DRISDOL ) 1.25 MG (50000 UNIT) CAPS capsule; Take 1 capsule (50,000 Units total) by mouth every 7 (seven) days.   Follow-up pending labs, yearly for physical

## 2023-10-25 ENCOUNTER — Ambulatory Visit: Payer: Self-pay | Admitting: Medical

## 2023-10-25 LAB — COMPREHENSIVE METABOLIC PANEL WITH GFR
Albumin: 4.2 g/dL (ref 3.9–4.9)
Alkaline Phosphatase: 47 IU/L (ref 44–121)
BUN/Creatinine Ratio: 18 (ref 12–28)
Chloride: 100 mmol/L (ref 96–106)
Creatinine, Ser: 0.93 mg/dL (ref 0.57–1.00)
Globulin, Total: 2.2 g/dL (ref 1.5–4.5)
Glucose: 100 mg/dL — AB (ref 70–99)
Potassium: 4.9 mmol/L (ref 3.5–5.2)
Sodium: 140 mmol/L (ref 134–144)
Total Protein: 6.4 g/dL (ref 6.0–8.5)

## 2023-10-25 LAB — CBC
Hematocrit: 39.9 % (ref 34.0–46.6)
Hemoglobin: 13 g/dL (ref 11.1–15.9)
MCH: 32.4 pg (ref 26.6–33.0)
MCHC: 32.6 g/dL (ref 31.5–35.7)
MCV: 100 fL — ABNORMAL HIGH (ref 79–97)
Platelets: 270 10*3/uL (ref 150–450)
RBC: 4.01 x10E6/uL (ref 3.77–5.28)
RDW: 12.6 % (ref 11.7–15.4)
WBC: 5.6 10*3/uL (ref 3.4–10.8)

## 2023-10-25 LAB — HEMOGLOBIN A1C
Est. average glucose Bld gHb Est-mCnc: 114 mg/dL
Hgb A1c MFr Bld: 5.6 % (ref 4.8–5.6)

## 2023-10-25 LAB — T4, FREE: Free T4: 1.73 ng/dL (ref 0.82–1.77)

## 2023-10-25 LAB — LIPID PANEL
Cholesterol, Total: 176 mg/dL (ref 100–199)
VLDL Cholesterol Cal: 11 mg/dL (ref 5–40)

## 2023-10-25 MED ORDER — LEVOTHYROXINE SODIUM 88 MCG PO TABS: 88.0000 ug | ORAL_TABLET | Freq: Every day | ORAL | 3 refills | Status: AC

## 2023-10-25 MED ORDER — LOVASTATIN 20 MG PO TABS: 20.0000 mg | ORAL_TABLET | Freq: Every day | ORAL | 3 refills | Status: DC

## 2023-10-25 MED ORDER — VITAMIN D (ERGOCALCIFEROL) 1.25 MG (50000 UNIT) PO CAPS: 50000.0000 [IU] | ORAL_CAPSULE | ORAL | 3 refills | Status: AC

## 2023-10-25 MED ORDER — OLMESARTAN MEDOXOMIL-HCTZ 20-12.5 MG PO TABS: 1.0000 | ORAL_TABLET | Freq: Every day | ORAL | 3 refills | Status: AC

## 2023-10-25 NOTE — Progress Notes (Signed)
 Results sent through MyChart

## 2023-10-25 NOTE — Addendum Note (Signed)
 Addended by: BULAH ALM RAMAN on: 10/25/2023 07:48 AM   Modules accepted: Orders

## 2023-10-30 ENCOUNTER — Telehealth: Payer: Self-pay | Admitting: Internal Medicine

## 2023-10-30 NOTE — Telephone Encounter (Signed)
 Order was faxed over already

## 2023-10-30 NOTE — Telephone Encounter (Signed)
 Faxed order over   Copied from CRM [id number]. Topic: Clinical - Request for Lab/Test Order >> Oct 30, 2023 11:00 AM Yolanda T wrote: Reason for CRM: patient called to have the chest CT Lung order faxed to [facility] at [phone]. Patient said with the order she can get that done for free with her job

## 2023-11-11 ENCOUNTER — Ambulatory Visit: Payer: Self-pay | Admitting: Medical

## 2023-11-11 ENCOUNTER — Encounter: Payer: Self-pay | Admitting: Medical

## 2023-11-11 NOTE — Progress Notes (Signed)
 Results sent through MyChart

## 2023-11-12 ENCOUNTER — Other Ambulatory Visit: Payer: Self-pay | Admitting: Medical

## 2023-11-12 MED ORDER — ROSUVASTATIN CALCIUM 20 MG PO TABS: 20.0000 mg | ORAL_TABLET | Freq: Every day | ORAL | 1 refills | Status: DC

## 2023-11-27 ENCOUNTER — Other Ambulatory Visit: Payer: Self-pay

## 2023-11-28 ENCOUNTER — Other Ambulatory Visit: Payer: Self-pay | Admitting: Medical

## 2023-11-28 DIAGNOSIS — M8580 Other specified disorders of bone density and structure, unspecified site: Secondary | ICD-10-CM

## 2023-12-16 ENCOUNTER — Telehealth: Payer: Self-pay | Admitting: Medical

## 2023-12-16 NOTE — Telephone Encounter (Signed)
 Spoke with patient, let her know that I faxed over paper work.

## 2023-12-16 NOTE — Telephone Encounter (Unsigned)
 Copied from CRM #[id number]. Topic: Clinical - Request for Lab/Test Order >> Dec 16, 2023 10:58 AM Donee H wrote: Reason for CRM: Patient called requesting for an order for Bone Density test to be sent over to: [facility] [address] [place], [place] [address] [phone]  Patient stated originally the order was sent to another facility but there is no availably until April of next year.   Follow up with patient at  [phone]

## 2023-12-16 NOTE — Telephone Encounter (Signed)
 I faxed over the paper for [facility].

## 2024-02-04 LAB — HM MAMMOGRAPHY

## 2024-02-04 LAB — HM DEXA SCAN

## 2024-02-05 ENCOUNTER — Ambulatory Visit: Payer: Self-pay | Admitting: Internal Medicine

## 2024-05-09 ENCOUNTER — Other Ambulatory Visit: Payer: Self-pay | Admitting: Medical
